# Patient Record
Sex: Female | Born: 1985 | Race: White | Hispanic: No | Marital: Married | Smoking: Former smoker
Health system: Southern US, Community
[De-identification: ages and names within clinical notes are randomized; demographics above are authoritative.]

## PROBLEM LIST (undated history)

## (undated) ENCOUNTER — Inpatient Hospital Stay (HOSPITAL_COMMUNITY): Payer: Self-pay

## (undated) DIAGNOSIS — K219 Gastro-esophageal reflux disease without esophagitis: Secondary | ICD-10-CM

## (undated) DIAGNOSIS — R519 Headache, unspecified: Secondary | ICD-10-CM

## (undated) DIAGNOSIS — O24419 Gestational diabetes mellitus in pregnancy, unspecified control: Secondary | ICD-10-CM

## (undated) DIAGNOSIS — R51 Headache: Secondary | ICD-10-CM

## (undated) DIAGNOSIS — F419 Anxiety disorder, unspecified: Secondary | ICD-10-CM

## (undated) DIAGNOSIS — F32A Depression, unspecified: Secondary | ICD-10-CM

## (undated) DIAGNOSIS — F329 Major depressive disorder, single episode, unspecified: Secondary | ICD-10-CM

## (undated) DIAGNOSIS — D649 Anemia, unspecified: Secondary | ICD-10-CM

## (undated) DIAGNOSIS — M797 Fibromyalgia: Secondary | ICD-10-CM

## (undated) DIAGNOSIS — Z8619 Personal history of other infectious and parasitic diseases: Secondary | ICD-10-CM

## (undated) HISTORY — DX: Anemia, unspecified: D64.9

## (undated) HISTORY — DX: Headache: R51

## (undated) HISTORY — DX: Gestational diabetes mellitus in pregnancy, unspecified control: O24.419

## (undated) HISTORY — PX: TONSILLECTOMY: SHX5217

## (undated) HISTORY — DX: Depression, unspecified: F32.A

## (undated) HISTORY — DX: Anxiety disorder, unspecified: F41.9

## (undated) HISTORY — DX: Major depressive disorder, single episode, unspecified: F32.9

## (undated) HISTORY — PX: CHOLECYSTECTOMY: SHX55

## (undated) HISTORY — DX: Headache, unspecified: R51.9

## (undated) HISTORY — DX: Personal history of other infectious and parasitic diseases: Z86.19

## (undated) HISTORY — PX: IUD REMOVAL: SHX5392

---
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
## 2009-08-06 ENCOUNTER — Ambulatory Visit: Payer: Self-pay | Admitting: Internal Medicine

## 2009-08-06 DIAGNOSIS — F411 Generalized anxiety disorder: Secondary | ICD-10-CM | POA: Insufficient documentation

## 2009-08-06 DIAGNOSIS — J45909 Unspecified asthma, uncomplicated: Secondary | ICD-10-CM | POA: Insufficient documentation

## 2009-08-06 DIAGNOSIS — F329 Major depressive disorder, single episode, unspecified: Secondary | ICD-10-CM

## 2009-11-05 ENCOUNTER — Ambulatory Visit: Payer: Self-pay | Admitting: Internal Medicine

## 2009-11-10 ENCOUNTER — Telehealth: Payer: Self-pay | Admitting: Internal Medicine

## 2009-11-10 ENCOUNTER — Encounter: Payer: Self-pay | Admitting: Internal Medicine

## 2009-11-12 ENCOUNTER — Encounter: Payer: Self-pay | Admitting: Internal Medicine

## 2009-11-12 ENCOUNTER — Ambulatory Visit: Payer: Self-pay | Admitting: Internal Medicine

## 2009-11-13 ENCOUNTER — Encounter: Payer: Self-pay | Admitting: Internal Medicine

## 2009-11-13 ENCOUNTER — Telehealth: Payer: Self-pay | Admitting: Internal Medicine

## 2009-12-12 ENCOUNTER — Telehealth: Payer: Self-pay | Admitting: Internal Medicine

## 2010-02-02 ENCOUNTER — Telehealth: Payer: Self-pay | Admitting: Internal Medicine

## 2010-02-02 ENCOUNTER — Ambulatory Visit: Payer: Self-pay | Admitting: Internal Medicine

## 2010-11-17 NOTE — Letter (Signed)
Summary: Out of [facility]  [address] [place], [place] [address]   Phone: [phone]  Fax: [phone]     November 10, 2009    Student:  [employer] November    To Whom It May Concern:   For Medical reasons, please excuse the above named student from school for the following dates:     November 05, 2009 and November 06, 2009    If you need additional information, please feel free to contact our office.   Sincerely,    Rock Nephew CMA for Sanda Linger, M.D.    ****This is a legal document and cannot be tampered with.  Schools are authorized to verify all information and to do so accordingly.

## 2010-11-17 NOTE — Progress Notes (Signed)
Summary: Results, school note  Phone Note Call from Patient Call back at [facility] Phone [phone]   Summary of Call: Patient is calling for for chest xray results and states that she has now started vomiting. Please advise. Initial call taken by: Lucious Groves,  November 13, 2009 9:01 AM  Follow-up for Phone Call        xray normal, go to an ER if needed Follow-up by: Etta Grandchild MD,  November 13, 2009 9:04 AM  Additional Follow-up for Phone Call Additional follow up Details #1::        Pt informed, she is req note for school for yesterday and today. OK?  Additional Follow-up by: Lamar Sprinkles, CMA,  November 13, 2009 10:21 AM    Additional Follow-up for Phone Call Additional follow up Details #2::    yes Follow-up by: Etta Grandchild MD,  November 13, 2009 10:30 AM  Additional Follow-up for Phone Call Additional follow up Details #3:: Details for Additional Follow-up Action Taken: Pt informed  Additional Follow-up by: Lamar Sprinkles, CMA,  November 14, 2009 12:06 PM

## 2010-11-17 NOTE — Assessment & Plan Note (Signed)
Summary: NOT ANY BETTER/ CHILLS/ ACHES/ NO FEVER/NWS  #   Vital Signs:  Patient profile:   25 year old female Menstrual status:  irregular Height:      68 inches Weight:      199 pounds BMI:     30.37 O2 Sat:      97 % on Room air Temp:     97.7 degrees F oral Pulse rate:   97 / minute Pulse rhythm:   regular Resp:     16 per minute BP sitting:   116 / 80  (left arm) Cuff size:   large  Vitals Entered By: Rock Nephew CMA (November 12, 2009 10:26 AM)  Nutrition Counseling: Patient's BMI is greater than 25 and therefore counseled on weight management options.  O2 Flow:  Room air  Primary Care Provider:  Etta Grandchild MD   History of Present Illness: She returns with persistent URI symptoms. She was getting better until symptoms worsened 2 days ago with red eyes, runny nose, NP cough, chills, and  LGF, but no wheezing, night sweats, sore throat, facial pain, earaches.  Asthma History    Asthma Control Assessment:    Age range: 12+ years    Symptoms: 0-2 days/week    Nighttime Awakenings: 0-2/month    Interferes w/ normal activity: no limitations    SABA use (not for EIB): 0-2 days/week    ATAQ questionnaire: 0    Asthma Control Assessment: Well Controlled   Current Medications (verified): 1)  Zoloft 100 Mg Tabs (Sertraline Hcl) .... Take 1 Tablet By Mouth Once A Day 2)  Lunesta 2 Mg Tabs (Eszopiclone) .... As Needed 3)  Xopenex 4)  Qvar 80 Mcg/act Aers (Beclomethasone Dipropionate) .... One Puff Bid 5)  Mirena 20 Mcg/24hr Iud (Levonorgestrel)  Allergies (verified): No Known Drug Allergies  Past History:  Past Medical History: Reviewed history from 08/06/2009 and no changes required. Anxiety Asthma Depression  Past Surgical History: Reviewed history from 08/06/2009 and no changes required. Cholecystectomy Tonsillectomy  Family History: Reviewed history from 08/06/2009 and no changes required. Family History Lung cancer  Social History: Reviewed  history from 08/06/2009 and no changes required. 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  Single Never Smoked Alcohol use-yes Drug use-no Regular exercise-yes  Review of Systems  The patient denies anorexia, weight loss, chest pain, headaches, hemoptysis, abdominal pain, and enlarged lymph nodes.   Eyes:  Complains of red eye; denies blurring, discharge, double vision, eye irritation, eye pain, itching, and light sensitivity. ENT:  Complains of earache; denies decreased hearing, difficulty swallowing, ear discharge, nosebleeds, sinus pressure, and sore throat.  Physical Exam  General:  alert, well-developed, well-nourished, and well-hydrated.   Eyes:  conjunctival injection, L>R, but no exudate.   Ears:  R ear normal and L ear normal.   Nose:  External nasal examination shows no deformity or inflammation. Nasal mucosa are pink and moist without lesions or exudates. Mouth:  no exudates, no pharyngeal crowing, no lesions, no aphthous ulcers, no erosions, no tongue abnormalities, no leukoplakia, no petechiae, pharyngeal erythema, and posterior lymphoid hypertrophy.   Neck:  supple, full ROM, no masses, no thyromegaly, no thyroid nodules or tenderness, no cervical lymphadenopathy, and no neck tenderness.   Lungs:  normal respiratory effort, no intercostal retractions, no accessory muscle use, normal breath sounds, no dullness, no fremitus, no crackles, and no wheezes.   Heart:  normal rate, regular rhythm, no murmur, no gallop, no rub, and no JVD.  Abdomen:  soft, non-tender, normal bowel sounds, no distention, no masses, no guarding, no rigidity, no rebound tenderness, no hepatomegaly, and no splenomegaly.   Msk:  No deformity or scoliosis noted of thoracic or lumbar spine.   Pulses:  R and L carotid,radial,femoral,dorsalis pedis and posterior tibial pulses are full and equal bilaterally Extremities:  No clubbing, cyanosis, edema, or deformity noted with  normal full range of motion of all joints.   Neurologic:  No cranial nerve deficits noted. Station and gait are normal. Plantar reflexes are down-going bilaterally. DTRs are symmetrical throughout. Sensory, motor and coordinative functions appear intact. Skin:  Intact without suspicious lesions or rashes Cervical Nodes:  no anterior cervical adenopathy and no posterior cervical adenopathy.   Axillary Nodes:  no R axillary adenopathy and no L axillary adenopathy.   Psych:  Oriented X3, memory intact for recent and remote, normally interactive, good eye contact, not agitated, not suicidal, not homicidal, subdued, and slightly anxious.     Impression & Recommendations:  Problem # 1:  BRONCHITIS-ACUTE (ICD-466.0) Assessment New  Her updated medication list for this problem includes:    Qvar 80 Mcg/act Aers (Beclomethasone dipropionate) ..... One puff bid    Mytussin Ac 100-10 Mg/67ml Syrp (Guaifenesin-codeine) .Marland Kitchen... 5-10 ml by mouth qid as needed for cough    Ceftin 500 Mg Tab (Cefuroxime axetil) .Marland Kitchen... Take one (1) tablet by mouth two (2) times a day x 10 days  Orders: Admin of Therapeutic Inj  intramuscular or subcutaneous (13244) Depo- Medrol 40mg  (J1030) Depo- Medrol 80mg  (J1040)  Problem # 2:  COUGH (ICD-786.2) Assessment: New  Orders: T-2 View CXR (71020TC)  Problem # 3:  ASTHMA (ICD-493.90) Assessment: Improved  Her updated medication list for this problem includes:    Qvar 80 Mcg/act Aers (Beclomethasone dipropionate) ..... One puff bid  Pulmonary Functions Reviewed: O2 sat: 97 (11/12/2009)  Complete Medication List: 1)  Zoloft 100 Mg Tabs (Sertraline hcl) .... Take 1 tablet by mouth once a day 2)  Lunesta 2 Mg Tabs (Eszopiclone) .... As needed 3)  Xopenex  4)  Qvar 80 Mcg/act Aers (Beclomethasone dipropionate) .... One puff bid 5)  Mirena 20 Mcg/24hr Iud (Levonorgestrel) 6)  Mytussin Ac 100-10 Mg/45ml Syrp (Guaifenesin-codeine) .... 5-10 ml by mouth qid as needed for  cough 7)  Ceftin 500 Mg Tab (Cefuroxime axetil) .... Take one (1) tablet by mouth two (2) times a day x 10 days  Patient Instructions: 1)  Please schedule a follow-up appointment in 2 weeks. 2)  Take your antibiotic as prescribed until ALL of it is gone, but stop if you develop a rash or swelling and contact our office as soon as possible. 3)  Acute bronchitis symptoms for less than 10 days are not helped by antibiotics. take over the counter cough medications. call if no improvment in  5-7 days, sooner if increasing cough, fever, or new symptoms( shortness of breath, chest pain). Prescriptions: CEFTIN 500 MG TAB (CEFUROXIME AXETIL) Take one (1) tablet by mouth two (2) times a day X 10 days  #20 x 1   Entered and Authorized by:   Etta Grandchild MD   Signed by:   Etta Grandchild MD on 11/12/2009   Method used:   Print then Give to Patient   RxID:   [id number] MYTUSSIN AC 100-10 MG/5ML SYRP (GUAIFENESIN-CODEINE) 5-10 ml by mouth QID as needed for cough  #6 ounces x 1   Entered and Authorized by:   Etta Grandchild MD  Signed by:   Etta Grandchild MD on 11/12/2009   Method used:   Print then Give to Patient   RxID:   [id number]    Medication Administration  Injection # 1:    Medication: Depo- Medrol 80mg     Diagnosis: BRONCHITIS-ACUTE (ICD-466.0)    Route: IM    Site: R deltoid    Exp Date: 07/2010    Lot #: [id number]    Mfr: [email or web address]    Patient tolerated injection without complications    Given by: Rock Nephew CMA (November 12, 2009 11:06 AM)  Injection # 2:    Medication: Depo- Medrol 40mg     Diagnosis: BRONCHITIS-ACUTE (ICD-466.0)    Route: IM    Site: R deltoid    Exp Date: 07/2010    Lot #: [id number]    Mfr: [email or web address]    Patient tolerated injection without complications    Given by: Rock Nephew CMA (November 12, 2009 11:06 AM)  Orders Added: 1)  Admin of Therapeutic Inj  intramuscular or subcutaneous [96372] 2)  Depo- Medrol 40mg  [J1030] 3)  Depo- Medrol 80mg   [J1040] 4)  T-2 View CXR [71020TC] 5)  Est. Patient Level IV [84696]

## 2010-11-17 NOTE — Progress Notes (Signed)
Summary: REFERRAL  Phone Note Call from Patient Call back at Home Phone [phone]   Summary of Call: Pt was referred to psychiatrist. They are unable to see pt until end of May. Pt's aunt is req suggestion for earlier apt.  Initial call taken by: Lamar Sprinkles, CMA,  February 02, 2010 11:18 AM  Follow-up for Phone Call        [facility] [phone] Follow-up by: Etta Grandchild MD,  February 02, 2010 11:22 AM  Additional Follow-up for Phone Call Additional follow up Details #1::        Pt informed  Additional Follow-up by: Lamar Sprinkles, CMA,  February 02, 2010 1:44 PM

## 2010-11-17 NOTE — Assessment & Plan Note (Signed)
Summary: sore throat-fever-body aches  stc   Vital Signs:  Patient profile:   25 year old female Menstrual status:  irregular Height:      68 inches Weight:      200 pounds BMI:     30.52 O2 Sat:      97 % on Room air Temp:     98.1 degrees F oral Pulse rate:   80 / minute Pulse rhythm:   regular Resp:     16 per minute BP sitting:   112 / 70  (left arm) Cuff size:   large  Vitals Entered By: Rock Nephew CMA (November 05, 2009 1:24 PM)  Nutrition Counseling: Patient's BMI is greater than 25 and therefore counseled on weight management options.  O2 Flow:  Room air CC: fever, congestion, bodyache, , URI symptoms Is Patient Diabetic? No   Primary Care Provider:  Etta Grandchild MD  CC:  fever, congestion, bodyache, , and URI symptoms.  History of Present Illness:  URI Symptoms      This is a 25 year old woman who presents with URI symptoms.  The symptoms began 3 days ago.  The severity is described as moderate.  The patient reports nasal congestion, purulent nasal discharge, sore throat, and sick contacts, but denies dry cough, productive cough, and earache.  Associated symptoms include low-grade fever (<100.5 degrees).  The patient denies stiff neck, dyspnea, wheezing, rash, vomiting, diarrhea, and use of an antipyretic.  The patient denies headache, muscle aches, and severe fatigue.  Risk factors for Strep sinusitis include absence of cough.  The patient denies the following risk factors for Strep sinusitis: unilateral facial pain, unilateral nasal discharge, poor response to decongestant, double sickening, tooth pain, and tender adenopathy.    Asthma History    Asthma Control Assessment:    Age range: 12+ years    Symptoms: 0-2 days/week    Nighttime Awakenings: 0-2/month    Interferes w/ normal activity: no limitations    Asthma Control Assessment: Well Controlled   Preventive Screening-Counseling & Management  Alcohol-Tobacco     Alcohol drinks/day: <1     Alcohol  type: beer     >5/day in last 3 mos: no     Alcohol Counseling: not indicated; use of alcohol is not excessive or problematic     Feels need to cut down: no     Feels annoyed by complaints: no     Feels guilty re: drinking: no     Needs 'eye opener' in am: no     Smoking Status: never     Passive Smoke Exposure: no  Hep-HIV-STD-Contraception     Hepatitis Risk: no risk noted     HIV Risk: no risk noted     STD Risk: no risk noted      Drug Use:  no.    Medications Prior to Update: 1)  Zoloft 100 Mg Tabs (Sertraline Hcl) .... Take 1 Tablet By Mouth Once A Day 2)  Lunesta 2 Mg Tabs (Eszopiclone) .... As Needed 3)  Xopenex 4)  Qvar 80 Mcg/act Aers (Beclomethasone Dipropionate) .... One Puff Bid 5)  Mirena 20 Mcg/24hr Iud (Levonorgestrel)  Current Medications (verified): 1)  Zoloft 100 Mg Tabs (Sertraline Hcl) .... Take 1 Tablet By Mouth Once A Day 2)  Lunesta 2 Mg Tabs (Eszopiclone) .... As Needed 3)  Xopenex 4)  Qvar 80 Mcg/act Aers (Beclomethasone Dipropionate) .... One Puff Bid 5)  Mirena 20 Mcg/24hr Iud (Levonorgestrel)  Allergies (verified): No Known  Drug Allergies  Past History:  Past Medical History: Reviewed history from 08/06/2009 and no changes required. Anxiety Asthma Depression  Past Surgical History: Reviewed history from 08/06/2009 and no changes required. Cholecystectomy Tonsillectomy  Family History: Reviewed history from 08/06/2009 and no changes required. Family History Lung cancer  Social History: Reviewed history from 08/06/2009 and no changes required. 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  Single Never Smoked Alcohol use-yes Drug use-no Regular exercise-yes  Review of Systems  The patient denies anorexia, prolonged cough, abdominal pain, hematuria, suspicious skin lesions, enlarged lymph nodes, and angioedema.    Physical Exam  General:  alert, well-developed, well-nourished, and well-hydrated.     Ears:  R ear normal and L ear normal.   Nose:  External nasal examination shows no deformity or inflammation. Nasal mucosa are pink and moist without lesions or exudates. Mouth:  no exudates, no pharyngeal crowing, no lesions, no aphthous ulcers, no erosions, no tongue abnormalities, no leukoplakia, no petechiae, pharyngeal erythema, and posterior lymphoid hypertrophy.   Neck:  supple, full ROM, no masses, no thyromegaly, no thyroid nodules or tenderness, no cervical lymphadenopathy, and no neck tenderness.   Lungs:  normal respiratory effort, no intercostal retractions, no accessory muscle use, normal breath sounds, no dullness, no fremitus, no crackles, and no wheezes.   Heart:  normal rate, regular rhythm, no murmur, no gallop, no rub, and no JVD.   Abdomen:  soft, non-tender, normal bowel sounds, no distention, no masses, no guarding, no rigidity, no rebound tenderness, no hepatomegaly, and no splenomegaly.   Msk:  No deformity or scoliosis noted of thoracic or lumbar spine.   Pulses:  R and L carotid,radial,femoral,dorsalis pedis and posterior tibial pulses are full and equal bilaterally Extremities:  No clubbing, cyanosis, edema, or deformity noted with normal full range of motion of all joints.   Skin:  Intact without suspicious lesions or rashes Cervical Nodes:  no anterior cervical adenopathy and no posterior cervical adenopathy.   Axillary Nodes:  no R axillary adenopathy and no L axillary adenopathy.   Psych:  Oriented X3, memory intact for recent and remote, normally interactive, good eye contact, not agitated, not suicidal, not homicidal, subdued, and slightly anxious.     Impression & Recommendations:  Problem # 1:  PHARYNGITIS-ACUTE (ICD-462) Assessment New  Her updated medication list for this problem includes:    Zithromax Tri-pak 500 Mg Tab (Azithromycin) .Marland Kitchen... Take as directed once daily for 3 days  Instructed to complete antibiotics and call if not improved in 48 hours.    Problem # 2:  ASTHMA (ICD-493.90) Assessment: Improved  Her updated medication list for this problem includes:    Qvar 80 Mcg/act Aers (Beclomethasone dipropionate) ..... One puff bid  Pulmonary Functions Reviewed: O2 sat: 97 (11/05/2009)  Complete Medication List: 1)  Zoloft 100 Mg Tabs (Sertraline hcl) .... Take 1 tablet by mouth once a day 2)  Lunesta 2 Mg Tabs (Eszopiclone) .... As needed 3)  Xopenex  4)  Qvar 80 Mcg/act Aers (Beclomethasone dipropionate) .... One puff bid 5)  Mirena 20 Mcg/24hr Iud (Levonorgestrel) 6)  Zithromax Tri-pak 500 Mg Tab (Azithromycin) .... Take as directed once daily for 3 days  Patient Instructions: 1)  Please schedule a follow-up appointment in 1 month. 2)  Get plenty of rest, drink lots of clear liquids, and use Tylenol or Ibuprofen for fever and comfort. Return in 7-10 days if you're not better:sooner if you're feeling worse. 3)  Take your antibiotic  as prescribed until ALL of it is gone, but stop if you develop a rash or swelling and contact our office as soon as possible. Prescriptions: ZITHROMAX TRI-PAK 500 MG TAB (AZITHROMYCIN) Take as directed once daily for 3 days  #3 x 0   Entered and Authorized by:   Etta Grandchild MD   Signed by:   Etta Grandchild MD on 11/05/2009   Method used:   Electronically to        [facility]  [id number]* (retail)       [address]       [place], [place]  [address]       Ph: [phone] or [phone]       Fax: [phone]   RxID:   [id number]

## 2010-11-17 NOTE — Letter (Signed)
Summary: Out of [facility]  [address] [place], [place] [address]   Phone: [phone]  Fax: [phone]    November 13, 2009   Student:  [employer] November    To Whom It May Concern:   For Medical reasons, please excuse the above named student from school for the following dates:  Start:   November 13, 2009  End:    November 14, 2009  If you need additional information, please feel free to contact our office.   Sincerely,    Lamar Sprinkles, CMA (AAMA) for Dr Leitha Schuller    ****This is a legal document and cannot be tampered with.  Schools are authorized to verify all information and to do so accordingly.

## 2010-11-17 NOTE — Progress Notes (Signed)
Summary: Alfonso Patten approval  Phone Note From Pharmacy   Caller: [phone] Summary of Call: PA request--Lunesta. I called prior authorization company and gave dx of insomnia. Med approved until 12-02-2010. Initial call taken by: Lucious Groves,  December 12, 2009 8:28 AM

## 2010-11-17 NOTE — Letter (Signed)
Summary: Results Follow-up Letter  [facility]  [address] [place], [place] [address]   Phone: [phone]  Fax: [phone]    11/12/2009  [id number] [place], [place]  [address]  Dear Ms. WELLS,   The following are the results of your recent test(s):  Test     Result     Chest Xray   _________________________________________________________  Please call for an appointment as directed _________________________________________________________ _________________________________________________________ _________________________________________________________  Sincerely,  Sanda Linger MD [facility]

## 2010-11-17 NOTE — Assessment & Plan Note (Signed)
Summary: FU ON MEDS/NWS #   Vital Signs:  Patient profile:   25 year old female Menstrual status:  irregular Height:      68 inches Weight:      195 pounds BMI:     29.76 O2 Sat:      97 % on Room air Temp:     98.2 degrees F oral Pulse rate:   93 / minute Pulse rhythm:   regular Resp:     16 per minute BP sitting:   102 / 64  (left arm) Cuff size:   large  Vitals Entered By: Rock Nephew CMA (February 02, 2010 8:45 AM)  Nutrition Counseling: Patient's BMI is greater than 25 and therefore counseled on weight management options.  O2 Flow:  Room air CC: follow-up visit//discuss follow up Is Patient Diabetic? No Pain Assessment Patient in pain? no        Primary Care Provider:  Etta Grandchild MD  CC:  follow-up visit//discuss follow up.  History of Present Illness: She returns for f/up and she says that Zoloft isn't working b/c she has constant worry "about everyhting all the time". She smokes marijuana to calm her nerves. She feels shaky and panicky at times. She has a good appetite but has lost weight in the last year. She feels a lack of motivation and has a hard time getting out of bed in the morning. She has insomnia but takes [place] with success.  Preventive Screening-Counseling & Management  Alcohol-Tobacco     Alcohol drinks/day: <1     Alcohol type: beer     >5/day in last 3 mos: no     Alcohol Counseling: not indicated; use of alcohol is not excessive or problematic     Feels need to cut down: no     Feels annoyed by complaints: no     Feels guilty re: drinking: no     Needs 'eye opener' in am: no     Smoking Status: never     Passive Smoke Exposure: no  Hep-HIV-STD-Contraception     Hepatitis Risk: no risk noted     HIV Risk: no risk noted     STD Risk: no risk noted      Sexual History:  currently monogamous.        Drug Use:  marijuana.        Blood Transfusions:  no.    Medications Prior to Update: 1)  Zoloft 100 Mg Tabs (Sertraline Hcl)  .... Take 1 Tablet By Mouth Once A Day 2)  Lunesta 2 Mg Tabs (Eszopiclone) .... As Needed 3)  Xopenex 4)  Qvar 80 Mcg/act Aers (Beclomethasone Dipropionate) .... One Puff Bid 5)  Mirena 20 Mcg/24hr Iud (Levonorgestrel) 6)  Mytussin Ac 100-10 Mg/17ml Syrp (Guaifenesin-Codeine) .... 5-10 Ml By Mouth Qid As Needed For Cough  Current Medications (verified): 1)  Zoloft 100 Mg Tabs (Sertraline Hcl) .... Take 1 Tablet By Mouth Once A Day 2)  Lunesta 2 Mg Tabs (Eszopiclone) .... As Needed 3)  Xopenex 4)  Qvar 80 Mcg/act Aers (Beclomethasone Dipropionate) .... One Puff Bid 5)  Mirena 20 Mcg/24hr Iud (Levonorgestrel)  Allergies (verified): No Known Drug Allergies  Past History:  Past Medical History: Reviewed history from 08/06/2009 and no changes required. Anxiety Asthma Depression  Past Surgical History: Reviewed history from 08/06/2009 and no changes required. Cholecystectomy Tonsillectomy  Family History: Reviewed history from 08/06/2009 and no changes required. Family History Lung cancer  Social History: Reviewed history from 08/06/2009  and no changes required. 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  Single Never Smoked Alcohol use-yes Drug use-no Regular exercise-yes Drug Use:  marijuana Sexual History:  currently monogamous Blood Transfusions:  no  Review of Systems  The patient denies chest pain, peripheral edema, headaches, abdominal pain, and suspicious skin lesions.   Psych:  Complains of anxiety, depression, easily tearful, irritability, and panic attacks; denies alternate hallucination ( auditory/visual), easily angered, sense of great danger, suicidal thoughts/plans, thoughts of violence, unusual visions or sounds, and thoughts /plans of harming others.  Physical Exam  General:  alert, well-developed, well-nourished, and well-hydrated.   Mouth:  no exudates, no pharyngeal crowing, no lesions, no aphthous ulcers, no erosions, no  tongue abnormalities, no leukoplakia, no petechiae, pharyngeal erythema, and posterior lymphoid hypertrophy.   Neck:  supple, full ROM, no masses, no thyromegaly, no thyroid nodules or tenderness, no cervical lymphadenopathy, and no neck tenderness.   Lungs:  normal respiratory effort, no intercostal retractions, no accessory muscle use, normal breath sounds, no dullness, no fremitus, no crackles, and no wheezes.   Heart:  normal rate, regular rhythm, no murmur, no gallop, no rub, and no JVD.   Abdomen:  soft, non-tender, normal bowel sounds, no distention, no masses, no guarding, no rigidity, no rebound tenderness, no hepatomegaly, and no splenomegaly.   Msk:  No deformity or scoliosis noted of thoracic or lumbar spine.   Extremities:  No clubbing, cyanosis, edema, or deformity noted with normal full range of motion of all joints.   Psych:  Oriented X3, memory intact for recent and remote, good eye contact, not agitated, not suicidal, not homicidal, dysphoric affect, tearful, and moderately anxious.     Impression & Recommendations:  Problem # 1:  DEPRESSION (ICD-311) Assessment Deteriorated  i advised her that smoking marijuana is sabotagging her mental health and I asked her to stop asap. I told her that Zoloft can't work if there is THC abuse. I asked her to see Vernell Leep and [facility] for psychotherapy for anxiety/panic/substance abuse.   Her updated medication list for this problem includes:    Zoloft 100 Mg Tabs (Sertraline hcl) .Marland Kitchen... Take 1 tablet by mouth once a day  Discussed treatment options, including trial of antidpressant medication. Will refer to behavioral health. Follow-up call in in 24-48 hours and recheck in 2 weeks, sooner as needed. Patient agrees to call if any worsening of symptoms or thoughts of doing harm arise. Verified that the patient has no suicidal ideation at this time.   Complete Medication List: 1)  Zoloft 100 Mg Tabs (Sertraline hcl) .... Take 1  tablet by mouth once a day 2)  Lunesta 2 Mg Tabs (Eszopiclone) .... As needed 3)  Xopenex  4)  Qvar 80 Mcg/act Aers (Beclomethasone dipropionate) .... One puff bid 5)  Mirena 20 Mcg/24hr Iud (Levonorgestrel)  Patient Instructions: 1)  Please schedule a follow-up appointment in 2 months. 2)  It is important that you exercise regularly at least 20 minutes 5 times a week. If you develop chest pain, have severe difficulty breathing, or feel very tired , stop exercising immediately and seek medical attention. 3)  You need to lose weight. Consider a lower calorie diet and regular exercise.

## 2010-11-17 NOTE — Progress Notes (Signed)
Summary: SCHOOL NOTE  Phone Note Call from Patient Call back at Home Phone [phone]   Summary of Call: Patient is requesting out of school note for the 19th & 20th. OK ? Initial call taken by: Lamar Sprinkles, CMA,  November 10, 2009 1:47 PM  Follow-up for Phone Call        yes Follow-up by: Etta Grandchild MD,  November 10, 2009 1:52 PM  Additional Follow-up for Phone Call Additional follow up Details #1::        Note printed and signed//lmovm to pick up. letter placed up front Additional Follow-up by: Rock Nephew CMA,  November 10, 2009 3:30 PM

## 2011-03-22 ENCOUNTER — Ambulatory Visit (INDEPENDENT_AMBULATORY_CARE_PROVIDER_SITE_OTHER): Payer: Federal, State, Local not specified - PPO | Admitting: Internal Medicine

## 2011-03-22 ENCOUNTER — Encounter: Payer: Self-pay | Admitting: Internal Medicine

## 2011-03-22 DIAGNOSIS — J45909 Unspecified asthma, uncomplicated: Secondary | ICD-10-CM

## 2011-03-22 DIAGNOSIS — J209 Acute bronchitis, unspecified: Secondary | ICD-10-CM

## 2011-03-22 DIAGNOSIS — F411 Generalized anxiety disorder: Secondary | ICD-10-CM

## 2011-03-22 MED ORDER — PSEUDOEPH-HYDROCODONE 60-5 MG/5ML PO SOLN: 5.0000 mL | Freq: Four times a day (QID) | ORAL | Status: DC | PRN

## 2011-03-22 MED ORDER — BECLOMETHASONE DIPROPIONATE 80 MCG/ACT IN AERS: 1.0000 | INHALATION_SPRAY | Freq: Two times a day (BID) | RESPIRATORY_TRACT | Status: DC

## 2011-03-22 MED ORDER — AZITHROMYCIN 500 MG PO TABS: 500.0000 mg | ORAL_TABLET | Freq: Every day | ORAL | Status: AC

## 2011-03-22 MED ORDER — LEVALBUTEROL TARTRATE 45 MCG/ACT IN AERO: 1.0000 | INHALATION_SPRAY | RESPIRATORY_TRACT | Status: DC | PRN

## 2011-03-22 MED ORDER — AZITHROMYCIN 500 MG PO TABS: 500.0000 mg | ORAL_TABLET | Freq: Every day | ORAL | Status: DC

## 2011-03-22 NOTE — Assessment & Plan Note (Signed)
Start zpak for the infection and rezira for symptoms relief

## 2011-03-22 NOTE — Assessment & Plan Note (Signed)
She is doing well on qvar and xopenex so I will continue them

## 2011-03-22 NOTE — Patient Instructions (Signed)
Asthma, Adult Asthma is caused by narrowing of the air passages in the lungs. It may be triggered by pollen, dust, animal dander, molds, some foods, respiratory infections, exposure to smoke, exercise, emotional stress or other allergens (things that cause allergic reactions or allergies). Repeat attacks are common. HOME CARE INSTRUCTIONS  Use prescription medications as ordered by your caregiver.   Avoid pollen, dust, animal dander, molds, smoke and other things that cause attacks at home and at work.   You may have fewer attacks if you decrease dust in your home. Electrostatic air cleaners may help.   It may help to replace your pillows or mattress with materials less likely to cause allergies.   Talk to your caregiver about an action plan for managing asthma attacks at home, including, the use of a peak flow meter which measures the severity of your asthma attack. An action plan can help minimize or stop the attack without having to seek medical care.   If you are not on a fluid restriction, drink 8 to 10 glasses of water each day.   Always have a plan prepared for seeking medical attention, including, calling your physician, accessing local emergency care, and calling 911 (in the [place] for a severe attack.   Discuss possible exercise routines with your caregiver.   If animal dander is the cause of asthma, you may need to get rid of pets.  SEEK MEDICAL CARE IF:  You have wheezing and shortness of breath even if taking medicine to prevent attacks.   An oral temperature above 100.5 develops.   You have muscle aches, chest pain or thickening of sputum.   Your sputum changes from clear or white to yellow, green, gray or bloody.   You have any problems that may be related to the medicine you are taking (such as a rash, itching, swelling or trouble breathing).  SEEK IMMEDIATE MEDICAL CARE IF:  Your usual medicines do not stop your wheezing or there is increased coughing and/or  shortness of breath.   You have increased difficulty breathing.  You have an oral temperature above 100.5Bronchitis Bronchitis is the body's way of reacting to injury and/or infection (inflammation) of the bronchi. Bronchi are the air tubes that extend from the windpipe into the lungs. If the inflammation becomes severe, it may cause shortness of breath.  CAUSES Inflammation may be caused by: A virus.  Germs (bacteria).  Dust.  Allergens.  Pollutants and many other irritants.  The cells lining the bronchial tree are covered with tiny hairs (cilia). These constantly beat upward, away from the lungs, toward the mouth. This keeps the lungs free of pollutants. When these cells become too irritated and are unable to do their job, mucus begins to develop. This causes the characteristic cough of bronchitis. The cough clears the lungs when the cilia are unable to do their job. Without either of these protective mechanisms, the mucus would settle in the lungs. Then you would develop pneumonia. Smoking is a common cause of bronchitis and can contribute to pneumonia. Stopping this habit is the single most important thing you can do to help yourself. TREATMENT Your caregiver may prescribe an antibiotic if the cough is caused by bacteria. Also, medicines that open up your airways make it easier to breathe. Your caregiver may also recommend or prescribe an expectorant. It will loosen the mucus to be coughed up. Only take over-the-counter or prescription medicines for pain, discomfort, or fever as directed by your caregiver.  Removing whatever causes the  problem (smoking, for example) is critical to preventing the problem from getting worse.  Cough suppressants may be prescribed for relief of cough symptoms.  Inhaled medicines may be prescribed to help with symptoms now and to help prevent problems from returning.  For those with recurrent (chronic) bronchitis, there may be a need for steroid medicines.  SEEK  IMMEDIATE MEDICAL CARE IF: During treatment, you develop more pus-like mucus (purulent sputum).  You or your child has an oral temperature above 100.5, not controlled by medicine.  Your baby is older than 3 months with a rectal temperature of 102 F (38.9 C) or higher.  Your baby is 49 months old or younger with a rectal temperature of 100.4 F (38 C) or higher.  You become progressively more ill.  You have increased difficulty breathing, wheezing, or shortness of breath.  It is necessary to seek immediate medical care if you are elderly or sick from any other disease. MAKE SURE YOU: Understand these instructions.  Will watch your condition.  Will get help right away if you are not doing well or get worse.  Document Released: 10/04/2005 Document Re-Released: 12/29/2009  [facility] Patient Information 2011 [place], [place]., not controlled by medicine.  MAKE SURE YOU:  Understand these instructions.   Will watch your condition.   Will get help right away if you are not doing well or get worse.  Document Released: 10/04/2005 Document Re-Released: 10/26/2009 [facility] Patient Information 2011 [place], [place].

## 2011-03-22 NOTE — Progress Notes (Signed)
Subjective:    Patient ID: Crystal [name], female    DOB: 1986/08/20, 25 y.o.   MRN: [id number]  Asthma She complains of cough and wheezing. There is no chest tightness, difficulty breathing, frequent throat clearing, hemoptysis, hoarse voice or shortness of breath. This is a chronic problem. The current episode started more than 1 year ago. The problem occurs intermittently. The problem has been unchanged. The cough is non-productive. Associated symptoms include a fever, myalgias, nasal congestion, rhinorrhea, sneezing and a sore throat. Pertinent negatives include no appetite change, chest pain, dyspnea on exertion, ear congestion, ear pain, headaches, heartburn, malaise/fatigue, orthopnea, PND, postnasal drip, sweats, trouble swallowing or weight loss. Her symptoms are aggravated by URI. Her symptoms are alleviated by steroid inhaler and beta-agonist. She reports significant improvement on treatment. Her past medical history is significant for asthma. There is no history of bronchiectasis, bronchitis, COPD, emphysema or pneumonia.      Review of Systems  Constitutional: Positive for fever and chills. Negative for weight loss, malaise/fatigue, diaphoresis, activity change, appetite change, fatigue and unexpected weight change.  HENT: Positive for congestion, sore throat, rhinorrhea, sneezing and sinus pressure. Negative for hearing loss, ear pain, nosebleeds, hoarse voice, facial swelling, trouble swallowing, neck pain, voice change, postnasal drip, tinnitus and ear discharge.   Respiratory: Positive for cough and wheezing. Negative for hemoptysis, chest tightness, shortness of breath and stridor.   Cardiovascular: Negative for chest pain, dyspnea on exertion, palpitations, leg swelling and PND.  Gastrointestinal: Negative for heartburn, nausea, vomiting, abdominal pain, diarrhea, constipation and blood in stool.  Genitourinary: Negative for dysuria, urgency, frequency, hematuria, decreased urine  volume, enuresis, difficulty urinating and dyspareunia.  Musculoskeletal: Positive for myalgias. Negative for back pain, joint swelling, arthralgias and gait problem.  Skin: Negative for color change, pallor and rash.  Neurological: Negative for dizziness, tremors, seizures, syncope, facial asymmetry, speech difficulty, weakness, light-headedness, numbness and headaches.  Hematological: Negative for adenopathy. Does not bruise/bleed easily.  Psychiatric/Behavioral: Negative.        Objective:   Physical Exam  [vitalsreviewed. Constitutional: She is oriented to person, place, and time. She appears well-developed and well-nourished. No distress.  HENT:  Head: Normocephalic and atraumatic.  Right Ear: External ear normal.  Left Ear: External ear normal.  Nose: Nose normal.  Mouth/Throat: Oropharynx is clear and moist. No oropharyngeal exudate.  Eyes: Conjunctivae and EOM are normal. Pupils are equal, round, and reactive to light. Right eye exhibits no discharge. Left eye exhibits no discharge. No scleral icterus.  Neck: Normal range of motion. Neck supple. No JVD present. No tracheal deviation present. No thyromegaly present.  Cardiovascular: Normal rate, regular rhythm, normal heart sounds and intact distal pulses.  Exam reveals no gallop and no friction rub.   No murmur heard. Pulmonary/Chest: Effort normal and breath sounds normal. No stridor. No respiratory distress. She has no wheezes. She has no rales. She exhibits no tenderness.  Abdominal: Soft. Bowel sounds are normal. She exhibits no distension and no mass. There is no tenderness. There is no rebound and no guarding.  Musculoskeletal: Normal range of motion. She exhibits no edema and no tenderness.  Lymphadenopathy:    She has no cervical adenopathy.  Neurological: She is alert and oriented to person, place, and time. She has normal reflexes. She displays normal reflexes. No cranial nerve deficit. She exhibits normal muscle tone.  Coordination normal.  Skin: Skin is warm and dry. No rash noted. She is not diaphoretic. No erythema. No pallor.  Psychiatric: She has a  normal mood and affect. Her behavior is normal. Judgment and thought content normal.          Assessment & Plan:

## 2011-08-19 ENCOUNTER — Other Ambulatory Visit (INDEPENDENT_AMBULATORY_CARE_PROVIDER_SITE_OTHER): Payer: Federal, State, Local not specified - PPO

## 2011-08-19 ENCOUNTER — Encounter: Payer: Self-pay | Admitting: Internal Medicine

## 2011-08-19 ENCOUNTER — Ambulatory Visit (INDEPENDENT_AMBULATORY_CARE_PROVIDER_SITE_OTHER): Payer: Federal, State, Local not specified - PPO | Admitting: Internal Medicine

## 2011-08-19 VITALS — BP 118/78 | HR 64 | Temp 98.6°F | Resp 16 | Wt 221.0 lb

## 2011-08-19 DIAGNOSIS — R209 Unspecified disturbances of skin sensation: Secondary | ICD-10-CM

## 2011-08-19 DIAGNOSIS — F411 Generalized anxiety disorder: Secondary | ICD-10-CM

## 2011-08-19 DIAGNOSIS — F329 Major depressive disorder, single episode, unspecified: Secondary | ICD-10-CM

## 2011-08-19 DIAGNOSIS — R200 Anesthesia of skin: Secondary | ICD-10-CM | POA: Insufficient documentation

## 2011-08-19 LAB — CBC WITH DIFFERENTIAL/PLATELET
Basophils Absolute: 0 10*3/uL (ref 0.0–0.1)
Basophils Relative: 0.6 % (ref 0.0–3.0)
Eosinophils Absolute: 0.1 10*3/uL (ref 0.0–0.7)
MCHC: 33.7 g/dL (ref 30.0–36.0)
MCV: 89.7 fl (ref 78.0–100.0)
Monocytes Absolute: 0.3 10*3/uL (ref 0.1–1.0)
Neutrophils Relative %: 55.6 % (ref 43.0–77.0)
Platelets: 235 10*3/uL (ref 150.0–400.0)
RBC: 4.63 Mil/uL (ref 3.87–5.11)
RDW: 13.2 % (ref 11.5–14.6)

## 2011-08-19 LAB — FOLATE: Folate: 9.2 ng/mL (ref >5.9)

## 2011-08-19 LAB — COMPREHENSIVE METABOLIC PANEL
AST: 16 U/L (ref 0–37)
Albumin: 4.4 g/dL (ref 3.5–5.2)
Alkaline Phosphatase: 73 U/L (ref 39–117)
Potassium: 4.4 mEq/L (ref 3.5–5.1)
Sodium: 140 mEq/L (ref 135–145)
Total Protein: 7.7 g/dL (ref 6.0–8.3)

## 2011-08-19 MED ORDER — CLONAZEPAM 1 MG PO TABS: 1.0000 mg | ORAL_TABLET | Freq: Three times a day (TID) | ORAL | Status: DC

## 2011-08-19 MED ORDER — FLUOXETINE HCL 20 MG PO CAPS: 20.0000 mg | ORAL_CAPSULE | Freq: Every day | ORAL | Status: DC

## 2011-08-19 NOTE — Progress Notes (Signed)
Subjective:    Patient ID: Crystal [name], female    DOB: 10-05-1986, 25 y.o.   MRN: [id number]  HPI She returns c/o intermittent numbness and tingling in her arms and legs for several months and worsening anxiety/panic. Her GYN changed her from Zoloft to Prozac several months ago but she has not been taking either recently.   Review of Systems  Constitutional: Negative for fever, chills, diaphoresis, activity change, appetite change and fatigue.  HENT: Negative.   Eyes: Negative.   Respiratory: Negative for apnea, cough, choking, chest tightness, shortness of breath, wheezing and stridor.   Cardiovascular: Negative for chest pain, palpitations and leg swelling.  Gastrointestinal: Negative for nausea, vomiting, abdominal pain, diarrhea, constipation and blood in stool.  Genitourinary: Negative for dysuria, urgency, hematuria, flank pain, decreased urine volume, enuresis, difficulty urinating and dyspareunia.  Musculoskeletal: Negative for myalgias, back pain, joint swelling, arthralgias and gait problem.  Skin: Negative for color change, pallor, rash and wound.  Neurological: Positive for numbness. Negative for dizziness, tremors, seizures, syncope, facial asymmetry, speech difficulty, weakness, light-headedness and headaches.  Hematological: Negative for adenopathy. Does not bruise/bleed easily.  Psychiatric/Behavioral: Negative for suicidal ideas, hallucinations, behavioral problems, confusion, sleep disturbance, self-injury, dysphoric mood, decreased concentration and agitation. The patient is nervous/anxious. The patient is not hyperactive.        Objective:   Physical Exam  Vitals reviewed. Constitutional: She is oriented to person, place, and time. She appears well-developed and well-nourished. No distress.  HENT:  Head: Normocephalic and atraumatic.  Mouth/Throat: Oropharynx is clear and moist. No oropharyngeal exudate.  Eyes: Conjunctivae are normal. Right eye exhibits no  discharge. Left eye exhibits no discharge. No scleral icterus.  Neck: Normal range of motion. Neck supple. No JVD present. No tracheal deviation present. No thyromegaly present.  Cardiovascular: Normal rate, regular rhythm, normal heart sounds and intact distal pulses.  Exam reveals no gallop and no friction rub.   No murmur heard. Pulmonary/Chest: Effort normal and breath sounds normal. No stridor. No respiratory distress. She has no wheezes. She has no rales. She exhibits no tenderness.  Abdominal: Soft. Bowel sounds are normal. She exhibits no distension and no mass. There is no tenderness. There is no rebound and no guarding.  Musculoskeletal: Normal range of motion. She exhibits no edema and no tenderness.  Lymphadenopathy:    She has no cervical adenopathy.  Neurological: She is alert and oriented to person, place, and time. She has normal strength. She is not disoriented. She displays no atrophy, no tremor and normal reflexes. A sensory deficit (decreased sensation in arms and legs) is present. No cranial nerve deficit. She exhibits normal muscle tone. She displays a negative Romberg sign. She displays no seizure activity. Coordination and gait normal. She displays no Babinski's sign on the right side. She displays no Babinski's sign on the left side.  Reflex Scores:      Tricep reflexes are 1+ on the right side and 1+ on the left side.      Bicep reflexes are 1+ on the right side and 1+ on the left side.      Brachioradialis reflexes are 1+ on the right side and 1+ on the left side.      Patellar reflexes are 1+ on the right side and 1+ on the left side.      Achilles reflexes are 1+ on the right side and 1+ on the left side. Skin: Skin is warm and dry. No rash noted. She is not diaphoretic. No erythema.  No pallor.  Psychiatric: She has a normal mood and affect. Her behavior is normal. Judgment and thought content normal.         Assessment & Plan:

## 2011-08-19 NOTE — Assessment & Plan Note (Signed)
Restart prozac and since she is having some panic I will offer her an Rx for klonopin prn as well

## 2011-08-19 NOTE — Patient Instructions (Signed)

## 2011-08-19 NOTE — Assessment & Plan Note (Signed)
She has a sensory deficit so I will evaluate accordingly with labs and may consider doing an EMG/NCS if her symptoms persist

## 2011-08-19 NOTE — Assessment & Plan Note (Signed)
-   Restart prozac

## 2011-08-20 ENCOUNTER — Encounter: Payer: Self-pay | Admitting: Internal Medicine

## 2011-12-22 ENCOUNTER — Emergency Department (HOSPITAL_COMMUNITY)
Admission: EM | Admit: 2011-12-22 | Discharge: 2011-12-22 | Disposition: A | Discharge status: Home Health | Payer: Federal, State, Local not specified - PPO | Attending: Emergency Medicine | Admitting: Emergency Medicine

## 2011-12-22 ENCOUNTER — Other Ambulatory Visit: Payer: Self-pay

## 2011-12-22 ENCOUNTER — Encounter (HOSPITAL_COMMUNITY): Payer: Self-pay

## 2011-12-22 ENCOUNTER — Emergency Department (HOSPITAL_COMMUNITY): Payer: Federal, State, Local not specified - PPO

## 2011-12-22 DIAGNOSIS — R110 Nausea: Secondary | ICD-10-CM | POA: Insufficient documentation

## 2011-12-22 DIAGNOSIS — R0602 Shortness of breath: Secondary | ICD-10-CM | POA: Insufficient documentation

## 2011-12-22 DIAGNOSIS — J45909 Unspecified asthma, uncomplicated: Secondary | ICD-10-CM | POA: Insufficient documentation

## 2011-12-22 DIAGNOSIS — R079 Chest pain, unspecified: Secondary | ICD-10-CM | POA: Insufficient documentation

## 2011-12-22 NOTE — ED Provider Notes (Signed)
History     CSN: [id number]  Arrival date & time 12/22/11  1216   First MD Initiated Contact with Patient 12/22/11 1318     Chief Complaint  Patient presents with  . Chest Pain    Patient is a 26 y.o. female presenting with chest pain. The history is provided by the patient.  Chest Pain The chest pain began 3 - 5 hours ago. Chest pain occurs constantly. The chest pain is improving. The pain is associated with eating. The severity of the pain is moderate. The quality of the pain is described as burning. Radiates to: "throat/neck" Exacerbated by: nothing. Primary symptoms include shortness of breath and nausea. Pertinent negatives for primary symptoms include no fever, no fatigue, no syncope, no cough, no abdominal pain, no vomiting and no dizziness.   pt reports she ate this morning, soon after took her daily meds (no recent change) and about 20 minutes later noticed chest burning.  After this started she felt SOB.  No syncope.  No vomiting.  No diaphoresis.  No back pain.  CP was not pleuritic.  No recent surgery.  No h/o DVT/PE.  She is not on OCPs.   She is now feeling improved She has been on phentermine for one month, no reaction since starting    Past Medical History  Diagnosis Date  . Asthma   . Depression   . Anxiety     Past Surgical History  Procedure Date  . Cholecystectomy   . Tonsillectomy     Family History  Problem Relation Age of Onset  . Cancer Other     Lung    History  Substance Use Topics  . Smoking status: Current Everyday Smoker -- 0.5 packs/day  . Smokeless tobacco: Not on file   Comment: Regular Exercise - Yes  . Alcohol Use: 2.4 oz/week    4 Glasses of wine per week    OB History    Grav Para Term Preterm Abortions TAB SAB Ect Mult Living                  Review of Systems  Constitutional: Negative for fever and fatigue.  Respiratory: Positive for shortness of breath. Negative for cough.   Cardiovascular: Positive for chest pain.  Negative for syncope.  Gastrointestinal: Positive for nausea. Negative for vomiting and abdominal pain.  Neurological: Negative for dizziness.  All other systems reviewed and are negative.    Allergies  Review of patient's allergies indicates no known allergies.  Home Medications   Current Outpatient Rx  Name Route Sig Dispense Refill  . BECLOMETHASONE DIPROPIONATE 80 MCG/ACT IN AERS Inhalation Inhale 1 puff into the lungs 2 (two) times daily. 1 Inhaler 12  . CLONAZEPAM 1 MG PO TABS Oral Take 1 mg by mouth 3 (three) times daily as needed. For anixety    . FLUOXETINE HCL 20 MG PO CAPS Oral Take 1 capsule (20 mg total) by mouth daily. 30 capsule 11  . LEVALBUTEROL TARTRATE 45 MCG/ACT IN AERO Inhalation Inhale 1-2 puffs into the lungs every 4 (four) hours as needed for wheezing. 1 Inhaler 12  . PHENTERMINE HCL 37.5 MG PO CAPS Oral Take 37.5 mg by mouth every morning.      BP 142/99  Pulse 102  Temp(Src) 98.3 F (36.8 C) (Oral)  Resp 20  Ht 5\' 7"  (1.702 m)  Wt 200 lb (90.719 kg)  BMI 31.32 kg/m2  SpO2 97%  LMP 12/05/2011  Physical Exam CONSTITUTIONAL: Well developed/well nourished  HEAD AND FACE: Normocephalic/atraumatic EYES: EOMI/PERRL ENMT: Mucous membranes moist NECK: supple no meningeal signs SPINE:entire spine nontender CV: S1/S2 noted, no murmurs/rubs/gallops noted LUNGS: Lungs are clear to auscultation bilaterally, no apparent distress ABDOMEN: soft, nontender, no rebound or guarding GU:no cva tenderness NEURO: Pt is awake/alert, moves all extremitiesx4 EXTREMITIES: pulses normal, full ROM, no edema, no calf tenderness SKIN: warm, color normal PSYCH: no abnormalities of mood noted  ED Course  Procedures   Labs Reviewed - No data to display Dg Chest 2 View  12/22/2011  *RADIOLOGY REPORT*  Clinical Data: Chest pain  CHEST - 2 VIEW  Comparison: 11/12/2009  Findings: The lungs are clear without focal consolidation, edema, effusion or pneumothorax.  Cardiopericardial  silhouette is within normal limits for size.  Imaged bony structures of the thorax are intact.  IMPRESSION: Stable.  Normal exam.  Original Report Authenticated By: ERIC A. MANSELL, M.D.     1. Chest pain    Pt well appearing, improved on my exam, no distress My suspicion for ACS/PE/Dissection is low EKG nondiagnostic We discussed strict return precautions   The patient appears reasonably screened and/or stabilized for discharge and I doubt any other medical condition or other [facility] requiring further screening, evaluation, or treatment in the ED at this time prior to discharge   MDM  Nursing notes reviewed and considered in documentation xrays reviewed and considered     Date: 12/22/2011  Rate: 103  Rhythm: sinus tachycardia  QRS Axis: normal  Intervals: normal  ST/T Wave abnormalities: nonspecific ST changes  Conduction Disutrbances:none  Narrative Interpretation: artifact noted due to tremor  Old EKG Reviewed: none available    Joya Gaskins, MD 12/22/11 1413

## 2011-12-22 NOTE — Discharge Instructions (Signed)

## 2011-12-22 NOTE — ED Notes (Signed)
Pt presents with onset of chest "burning" after taking diet pill and prozac this morning.  Pt reports she has been on same meds x 1 month, but only began to burn in chest today.  Pt reports burning went up her throat and around to back of neck and back of her head.  +shortness of breath.

## 2012-03-24 ENCOUNTER — Other Ambulatory Visit: Payer: Self-pay | Admitting: Internal Medicine

## 2013-02-26 ENCOUNTER — Ambulatory Visit: Payer: Self-pay | Admitting: Certified Nurse Midwife

## 2013-03-04 IMAGING — CR DG CHEST 2V
2 series · 2 of 2 positions shown · non-contrast
Comparison: 11/12/2009

CLINICAL DATA: Chest pain

CHEST - 2 VIEW

[w chest pa]
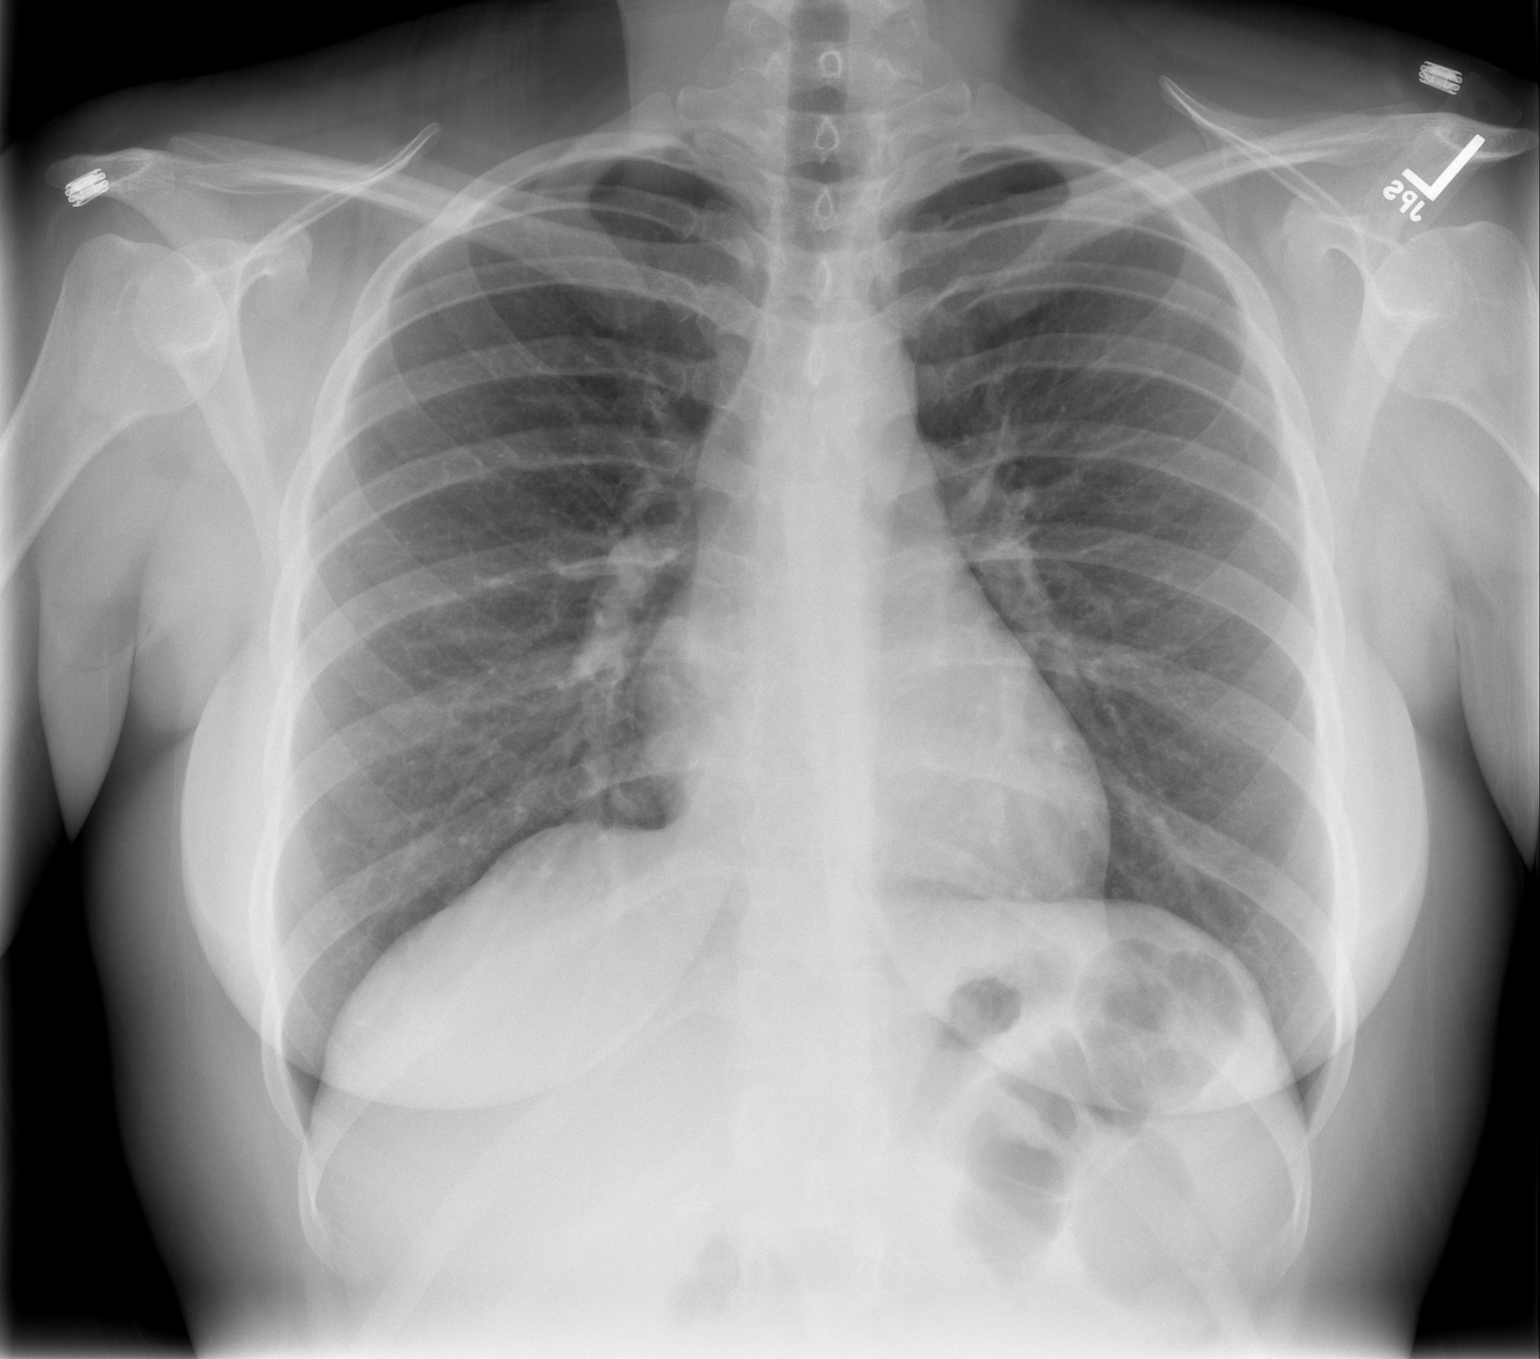

[w chest lat]
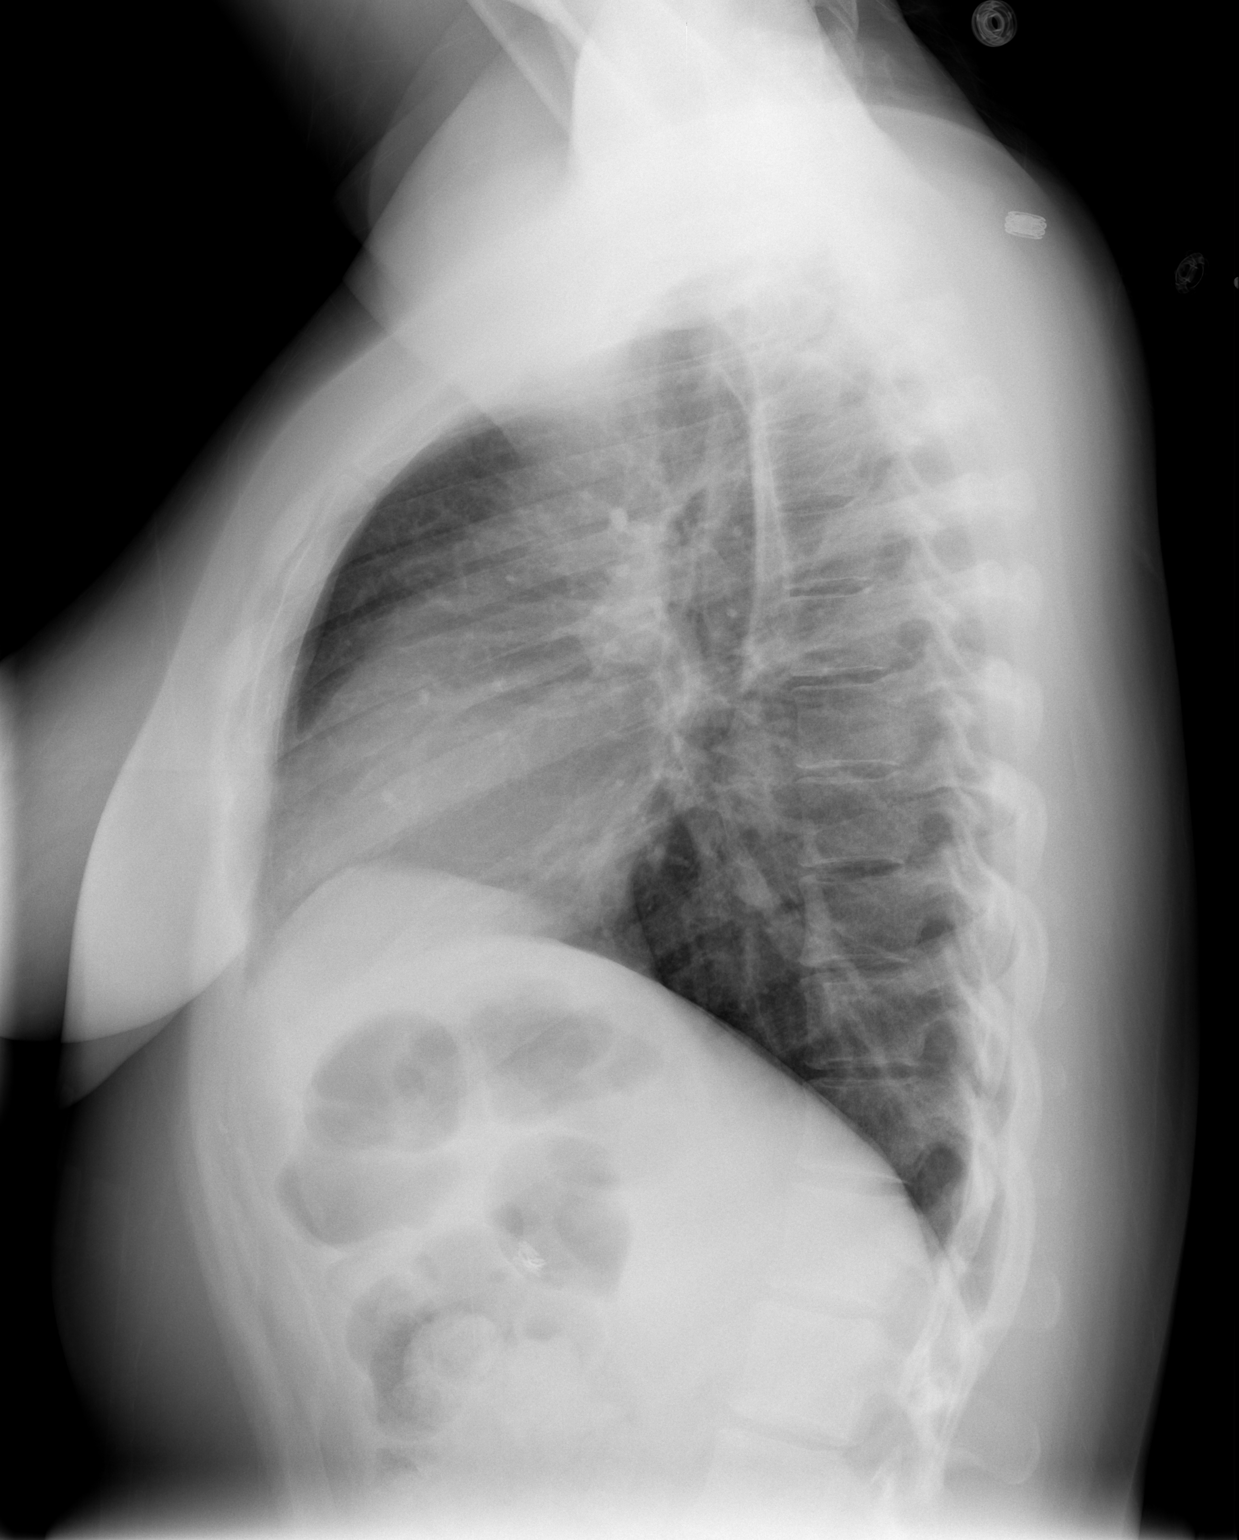

[2 of 2 positions shown; findings below may reference images not displayed]

FINDINGS: The lungs are clear without focal consolidation, edema,
effusion or pneumothorax.  Cardiopericardial silhouette is within
normal limits for size.  Imaged bony structures of the thorax are
intact.
IMPRESSION: Stable.  Normal exam.

## 2013-03-20 ENCOUNTER — Encounter: Payer: Self-pay | Admitting: Certified Nurse Midwife

## 2013-03-22 ENCOUNTER — Ambulatory Visit (INDEPENDENT_AMBULATORY_CARE_PROVIDER_SITE_OTHER): Payer: Federal, State, Local not specified - PPO | Admitting: Certified Nurse Midwife

## 2013-03-22 ENCOUNTER — Encounter: Payer: Self-pay | Admitting: Certified Nurse Midwife

## 2013-03-22 VITALS — BP 102/68 | HR 70 | Resp 16 | Ht 67.5 in | Wt 227.8 lb

## 2013-03-22 DIAGNOSIS — Z01419 Encounter for gynecological examination (general) (routine) without abnormal findings: Secondary | ICD-10-CM

## 2013-03-22 DIAGNOSIS — Z309 Encounter for contraceptive management, unspecified: Secondary | ICD-10-CM

## 2013-03-22 DIAGNOSIS — IMO0001 Reserved for inherently not codable concepts without codable children: Secondary | ICD-10-CM

## 2013-03-22 MED ORDER — NORETHIN ACE-ETH ESTRAD-FE 1-20 MG-MCG(24) PO TABS: 1.0000 | ORAL_TABLET | Freq: Every day | ORAL | Status: DC

## 2013-03-22 NOTE — Progress Notes (Signed)
27 y.o.   Single    Caucasian   female   [id number]   here for annual exam.  Periods spotting in between cycles, but has not been consistent with same time of day with taking pills.  Working on better schedule.  Happy, getting married in August 2014.  No health issues today.  Patient's last menstrual period was 03/18/2013.          Sexually active: yes  The current method of family planning is OCP (estrogen/progesterone).    Exercising: No Last mammogram:  n/a Last pap smear:02/21/12 History of abnormal pap: no Smoking:no has recently quit Alcohol:averages 1 per day wine or beer Last colonoscopy:n/a Last Bone Density: n/a  Last tetanus shot:before entering college Last cholesterol check: n/a  Hgb:      PCP          Urine: PCP   Family History  Problem Relation Age of Onset  . Cancer Other     Lung  . Anxiety disorder Mother     Patient Active Problem List   Diagnosis Date Noted  . Numbness and tingling 08/19/2011  . Anxiety, generalized 08/19/2011  . DEPRESSION 08/06/2009  . ASTHMA 08/06/2009    Past Medical History  Diagnosis Date  . Asthma   . Depression   . Anxiety     Past Surgical History  Procedure Laterality Date  . Cholecystectomy    . Tonsillectomy    . Iud removal      mirena inserted 1/08, removal 11/12  . Wisdom tooth extraction  2007    Allergies: Review of patient's allergies indicates no known allergies.  Current Outpatient Prescriptions  Medication Sig Dispense Refill  . beclomethasone (QVAR) 80 MCG/ACT inhaler Inhale 1 puff into the lungs 2 (two) times daily.  1 Inhaler  12  . clonazePAM (KLONOPIN) 1 MG tablet Take 1 tablet (1 mg total) by mouth 3 (three) times daily.  90 tablet  2  . clonazePAM (KLONOPIN) 1 MG tablet Take 1 mg by mouth 3 (three) times daily as needed. For anixety      . FLUoxetine (PROZAC) 20 MG capsule Take 1 capsule (20 mg total) by mouth daily.  30 capsule  11  . levalbuterol (XOPENEX HFA) 45 MCG/ACT inhaler Inhale 1-2 puffs  into the lungs every 4 (four) hours as needed for wheezing.  1 Inhaler  12  . Multiple Vitamins-Minerals (MULTIVITAMIN PO) Take by mouth daily.      . phentermine 37.5 MG capsule Take 37.5 mg by mouth every morning.       No current facility-administered medications for this visit.    ROS: Pertinent items are noted in HPI.      BP 102/68  Pulse 70  Resp 16  Ht 5' 7.5" (1.715 m)  Wt 227 lb 12.8 oz (103.329 kg)  BMI 35.13 kg/m2  LMP 03/18/2013   Wt Readings from Last 3 Encounters:  03/22/13 227 lb 12.8 oz (103.329 kg)  12/22/11 200 lb (90.719 kg)  08/19/11 221 lb (100.245 kg)     Ht Readings from Last 3 Encounters:  03/22/13 5' 7.5" (1.715 m)  12/22/11 5\' 7"  (1.702 m)  03/22/11 5\' 8"  (1.727 m)    General appearance: alert, cooperative and appears stated age Head: Normocephalic, without obvious abnormality, atraumatic Neck: no adenopathy, supple, symmetrical, trachea midline and thyroid not enlarged, symmetric, no tenderness/mass/nodules Lungs: clear to auscultation bilaterally Breasts: Inspection negative, No nipple retraction or dimpling, No nipple discharge or bleeding, No axillary or supraclavicular  adenopathy, Normal to palpation without dominant masses Heart: regular rate and rhythm Abdomen: soft, non-tender; bowel sounds normal; no masses,  no organomegaly Extremities: extremities normal, atraumatic, no cyanosis or edema Skin: Skin color, texture, turgor normal. No rashes or lesions Lymph nodes: Cervical, supraclavicular, and axillary nodes normal. No abnormal inguinal nodes palpated Neurologic: Grossly normal   Pelvic: External genitalia:  no lesions              Urethra:  normal appearing urethra with no masses, tenderness or lesions              Bartholins and Skenes: normal                 Vagina: normal appearing vagina with normal color and discharge, no lesions              Cervix: normal appearance              Pap taken: no        Bimanual Exam:   Uterus:  uterus is normal size, shape, consistency and nontender                                      Adnexa: normal adnexa in size, nontender and no masses                                      Rectovaginal: Confirms                                      Anus: deferred  A: Normal well woman exam Contraception: OCP     P:Reviewed health and wellness pertinent to exam Rx L[place] 24 fe see order pap smear per guidelines No pap smear taken today Wished well marriage counseled on breast self exam, use and side effects of OCP's, adequate intake of calcium and vitamin D, diet and exercise  return annually or prn     An After Visit Summary was printed and given to the patient.  Reviewed, TL

## 2013-03-22 NOTE — Patient Instructions (Signed)
General topics  Next pap or exam is  due in 1 year Take a Women's multivitamin Take 1200 mg. of calcium daily - prefer dietary If any concerns in interim to call back  Breast Self-Awareness Practicing breast self-awareness may pick up problems early, prevent significant medical complications, and possibly save your life. By practicing breast self-awareness, you can become familiar with how your breasts look and feel and if your breasts are changing. This allows you to notice changes early. It can also offer you some reassurance that your breast health is good. One way to learn what is normal for your breasts and whether your breasts are changing is to do a breast self-exam. If you find a lump or something that was not present in the past, it is best to contact your caregiver right away. Other findings that should be evaluated by your caregiver include nipple discharge, especially if it is bloody; skin changes or reddening; areas where the skin seems to be pulled in (retracted); or new lumps and bumps. Breast pain is seldom associated with cancer (malignancy), but should also be evaluated by a caregiver. BREAST SELF-EXAM The best time to examine your breasts is 5 7 days after your menstrual period is over.  ExitCare Patient Information 2013 [employer].   Exercise to Stay Healthy Exercise helps you become and stay healthy. EXERCISE IDEAS AND TIPS Choose exercises that:  You enjoy.  Fit into your day. You do not need to exercise really hard to be healthy. You can do exercises at a slow or medium level and stay healthy. You can:  Stretch before and after working out.  Try yoga, Pilates, or tai chi.  Lift weights.  Walk fast, swim, jog, run, climb stairs, bicycle, dance, or rollerskate.  Take aerobic classes. Exercises that burn about 150 calories:  Running 1  miles in 15 minutes.  Playing volleyball for 45 to 60 minutes.  Washing and waxing a car for 45 to 60  minutes.  Playing touch football for 45 minutes.  Walking 1  miles in 35 minutes.  Pushing a stroller 1  miles in 30 minutes.  Playing basketball for 30 minutes.  Raking leaves for 30 minutes.  Bicycling 5 miles in 30 minutes.  Walking 2 miles in 30 minutes.  Dancing for 30 minutes.  Shoveling snow for 15 minutes.  Swimming laps for 20 minutes.  Walking up stairs for 15 minutes.  Bicycling 4 miles in 15 minutes.  Gardening for 30 to 45 minutes.  Jumping rope for 15 minutes.  Washing windows or floors for 45 to 60 minutes. Document Released: 11/06/2010 Document Revised: 12/27/2011 Document Reviewed: 11/06/2010 Exit[employer] Patient Information 2013 [employer].   Other topics ( that may be useful information):    Sexually Transmitted Disease Sexually transmitted disease (STD) refers to any infection that is passed from person to person during sexual activity. This may happen by way of saliva, semen, blood, vaginal mucus, or urine. Common STDs include:  Gonorrhea.  Chlamydia.  Syphilis.  HIV/AIDS.  Genital herpes.  Hepatitis B and C.  Trichomonas.  Human papillomavirus (HPV).  Pubic lice. CAUSES  An STD may be spread by bacteria, virus, or parasite. A person can get an STD by:  Sexual intercourse with an infected person.  Sharing sex toys with an infected person.  Sharing needles with an infected person.  Having intimate contact with the genitals, mouth, or rectal areas of an infected person. SYMPTOMS  Some people may not have any symptoms, but   they can still pass the infection to others. Different STDs have different symptoms. Symptoms include:  Painful or bloody urination.  Pain in the pelvis, abdomen, vagina, anus, throat, or eyes.  Skin rash, itching, irritation, growths, or sores (lesions). These usually occur in the genital or anal area.  Abnormal vaginal discharge.  Penile discharge in men.  Soft, flesh-colored skin growths in the  genital or anal area.  Fever.  Pain or bleeding during sexual intercourse.  Swollen glands in the groin area.  Yellow skin and eyes (jaundice). This is seen with hepatitis. DIAGNOSIS  To make a diagnosis, your caregiver may:  Take a medical history.  Perform a physical exam.  Take a specimen (culture) to be examined.  Examine a sample of discharge under a microscope.  Perform blood test TREATMENT   Chlamydia, gonorrhea, trichomonas, and syphilis can be cured with antibiotic medicine.  Genital herpes, hepatitis, and HIV can be treated, but not cured, with prescribed medicines. The medicines will lessen the symptoms.  Genital warts from HPV can be treated with medicine or by freezing, burning (electrocautery), or surgery. Warts may come back.  HPV is a virus and cannot be cured with medicine or surgery.However, abnormal areas may be followed very closely by your caregiver and may be removed from the cervix, vagina, or vulva through office procedures or surgery. If your diagnosis is confirmed, your recent sexual partners need treatment. This is true even if they are symptom-free or have a negative culture or evaluation. They should not have sex until their caregiver says it is okay. HOME CARE INSTRUCTIONS  All sexual partners should be informed, tested, and treated for all STDs.  Take your antibiotics as directed. Finish them even if you start to feel better.  Only take over-the-counter or prescription medicines for pain, discomfort, or fever as directed by your caregiver.  Rest.  Eat a balanced diet and drink enough fluids to keep your urine clear or pale yellow.  Do not have sex until treatment is completed and you have followed up with your caregiver. STDs should be checked after treatment.  Keep all follow-up appointments, Pap tests, and blood tests as directed by your caregiver.  Only use latex condoms and water-soluble lubricants during sexual activity. Do not use  petroleum jelly or oils.  Avoid alcohol and illegal drugs.  Get vaccinated for HPV and hepatitis. If you have not received these vaccines in the past, talk to your caregiver about whether one or both might be right for you.  Avoid risky sex practices that can break the skin. The only way to avoid getting an STD is to avoid all sexual activity.Latex condoms and dental dams (for oral sex) will help lessen the risk of getting an STD, but will not completely eliminate the risk. SEEK MEDICAL CARE IF:   You have a fever.  You have any new or worsening symptoms. Document Released: 12/25/2002 Document Revised: 12/27/2011 Document Reviewed: 01/01/2011 ExitCare Patient Information 2013 [employer].    Domestic Abuse You are being battered or abused if someone close to you hits, pushes, or physically hurts you in any way. You also are being abused if you are forced into activities. You are being sexually abused if you are forced to have sexual contact of any kind. You are being emotionally abused if you are made to feel worthless or if you are constantly threatened. It is important to remember that help is available. No one has the right to abuse you. PREVENTION OF FURTHER   ABUSE  Learn the warning signs of danger. This varies with situations but may include: the use of alcohol, threats, isolation from friends and family, or forced sexual contact. Leave if you feel that violence is going to occur.  If you are attacked or beaten, report it to the police so the abuse is documented. You do not have to press charges. The police can protect you while you or the attackers are leaving. Get the officer's name and badge number and a copy of the report.  Find someone you can trust and tell them what is happening to you: your caregiver, a nurse, clergy member, close friend or family member. Feeling ashamed is natural, but remember that you have done nothing wrong. No one deserves abuse. Document Released:  10/01/2000 Document Revised: 12/27/2011 Document Reviewed: 12/10/2010 ExitCare Patient Information 2013 [employer].    How Much is Too Much Alcohol? Drinking too much alcohol can cause injury, accidents, and health problems. These types of problems can include:   Car crashes.  Falls.  Family fighting (domestic violence).  Drowning.  Fights.  Injuries.  Burns.  Damage to certain organs.  Having a baby with birth defects. ONE DRINK CAN BE TOO MUCH WHEN YOU ARE:  Working.  Pregnant or breastfeeding.  Taking medicines. Ask your doctor.  Driving or planning to drive. If you or someone you know has a drinking problem, get help from a doctor.  Document Released: 07/31/2009 Document Revised: 12/27/2011 Document Reviewed: 07/31/2009 ExitCare Patient Information 2013 [employer].   Smoking Hazards Smoking cigarettes is extremely bad for your health. Tobacco smoke has over 200 known poisons in it. There are over 60 chemicals in tobacco smoke that cause cancer. Some of the chemicals found in cigarette smoke include:   Cyanide.  Benzene.  Formaldehyde.  Methanol (wood alcohol).  Acetylene (fuel used in welding torches).  Ammonia. Cigarette smoke also contains the poisonous gases nitrogen oxide and carbon monoxide.  Cigarette smokers have an increased risk of many serious medical problems and Smoking causes approximately:  90% of all lung cancer deaths in men.  80% of all lung cancer deaths in women.  90% of deaths from chronic obstructive lung disease. Compared with nonsmokers, smoking increases the risk of:  Coronary heart disease by 2 to 4 times.  Stroke by 2 to 4 times.  Men developing lung cancer by 23 times.  Women developing lung cancer by 13 times.  Dying from chronic obstructive lung diseases by 12 times.  . Smoking is the most preventable cause of death and disease in our society.  WHY IS SMOKING ADDICTIVE?  Nicotine is the chemical  agent in tobacco that is capable of causing addiction or dependence.  When you smoke and inhale, nicotine is absorbed rapidly into the bloodstream through your lungs. Nicotine absorbed through the lungs is capable of creating a powerful addiction. Both inhaled and non-inhaled nicotine may be addictive.  Addiction studies of cigarettes and spit tobacco show that addiction to nicotine occurs mainly during the teen years, when young people begin using tobacco products. WHAT ARE THE BENEFITS OF QUITTING?  There are many health benefits to quitting smoking.   Likelihood of developing cancer and heart disease decreases. Health improvements are seen almost immediately.  Blood pressure, pulse rate, and breathing patterns start returning to normal soon after quitting. QUITTING SMOKING   American Lung Association - 1-800-LUNGUSA  American Cancer Society - 1-800-ACS-2345 Document Released: 11/11/2004 Document Revised: 12/27/2011 Document Reviewed: 07/16/2009 ExitCare Patient Information 2013 [employer].   Stress Management Stress is a state of physical or mental tension that often results from changes in your life or normal routine. Some common causes of stress are:  Death of a loved one.  Injuries or severe illnesses.  Getting fired or changing jobs.  Moving into a new home. Other causes may be:  Sexual problems.  Business or financial losses.  Taking on a large debt.  Regular conflict with someone at home or at work.  Constant tiredness from lack of sleep. It is not just bad things that are stressful. It may be stressful to:  Win the lottery.  Get married.  Buy a new car. The amount of stress that can be easily tolerated varies from person to person. Changes generally cause stress, regardless of the types of change. Too much stress can affect your health. It may lead to physical or emotional problems. Too little stress (boredom) may also become stressful. SUGGESTIONS TO  REDUCE STRESS:  Talk things over with your family and friends. It often is helpful to share your concerns and worries. If you feel your problem is serious, you may want to get help from a professional counselor.  Consider your problems one at a time instead of lumping them all together. Trying to take care of everything at once may seem impossible. List all the things you need to do and then start with the most important one. Set a goal to accomplish 2 or 3 things each day. If you expect to do too many in a single day you will naturally fail, causing you to feel even more stressed.  Do not use alcohol or drugs to relieve stress. Although you may feel better for a short time, they do not remove the problems that caused the stress. They can also be habit forming.  Exercise regularly - at least 3 times per week. Physical exercise can help to relieve that "uptight" feeling and will relax you.  The shortest distance between despair and hope is often a good night's sleep.  Go to bed and get up on time allowing yourself time for appointments without being rushed.  Take a short "time-out" period from any stressful situation that occurs during the day. Close your eyes and take some deep breaths. Starting with the muscles in your face, tense them, hold it for a few seconds, then relax. Repeat this with the muscles in your neck, shoulders, hand, stomach, back and legs.  Take good care of yourself. Eat a balanced diet and get plenty of rest.  Schedule time for having fun. Take a break from your daily routine to relax. HOME CARE INSTRUCTIONS   Call if you feel overwhelmed by your problems and feel you can no longer manage them on your own.  Return immediately if you feel like hurting yourself or someone else. Document Released: 03/30/2001 Document Revised: 12/27/2011 Document Reviewed: 11/20/2007 ExitCare Patient Information 2013 [employer].   

## 2013-05-10 ENCOUNTER — Encounter: Payer: Self-pay | Admitting: Nurse Practitioner

## 2013-05-10 ENCOUNTER — Ambulatory Visit (INDEPENDENT_AMBULATORY_CARE_PROVIDER_SITE_OTHER): Payer: Federal, State, Local not specified - PPO | Admitting: Nurse Practitioner

## 2013-05-10 VITALS — BP 102/58 | HR 72 | Resp 12 | Ht 68.5 in

## 2013-05-10 DIAGNOSIS — L723 Sebaceous cyst: Secondary | ICD-10-CM

## 2013-05-10 NOTE — Progress Notes (Signed)
Subjective:     Patient ID: Crystal [name], female   DOB: 1985/11/25, 27 y.o.   MRN: [id number]  HPI 27 yo Sw Fe presents with a history of recurrent sebaceous cyst on the upper left inner thigh.  She has had similar recurrences through the years and this time flared about a week ago while moving.  She felt the continual rubbing of the thighs has caused the flare.  A few days ago about the size of a golf ball and she applied aloe Vera cream and it has gone down.  No exudate ever expressed but still remains tender.  She has smaller areas around this larger one.  Review of Systems  Constitutional: Negative for fever, chills and fatigue.  Respiratory: Negative.   Cardiovascular: Negative.   Gastrointestinal: Negative.  Negative for abdominal pain.  Genitourinary: Negative.  Negative for dysuria, urgency, frequency, hematuria, flank pain, pelvic pain and dyspareunia.  Musculoskeletal: Negative.   Skin: Positive for wound.       Cystic area right inner thigh - recurrent.  Neurological: Negative.   Psychiatric/Behavioral: Negative.        Objective:   Physical Exam  Constitutional: She is oriented to person, place, and time. She appears well-developed and well-nourished.  Pulmonary/Chest: Effort normal.  Abdominal: Soft. She exhibits no distension and no mass. There is no tenderness. There is no rebound and no guarding.  Musculoskeletal: Normal range of motion.  Neurological: She is alert and oriented to person, place, and time.  Skin: Skin is warm and dry.     Sebaceous cyst right thigh with several areas of cystic lesions. No exudate or erythema. Largest center lesion is about 1 cm size and two other smaller areas are 1/2 cm size.  Psychiatric: She has a normal mood and affect. Her behavior is normal. Judgment and thought content normal.       Assessment:     History of recurrent sebaceous cyst right thigh    Plan:     Will make referral to Dr. Nino Parsley office for evaluation as she  will most likely need I & D of this area to keep from recurrence. Does not need antibiotic therapy at this time.  Advise not to cause any further irritation.

## 2013-05-14 ENCOUNTER — Telehealth: Payer: Self-pay | Admitting: Nurse Practitioner

## 2013-05-14 NOTE — Telephone Encounter (Signed)
Patient calling to find out about her referral to dermatologist .

## 2013-05-15 NOTE — Telephone Encounter (Signed)
Patty's note (not mine) said to refer to Dr. Nino Parsley office.  Not necessary Dr. Danella Deis.  That means anybody in the office is fine.

## 2013-05-15 NOTE — Telephone Encounter (Signed)
Patient notified of appointment with [facility] on July 31st @ 10:30am. With Dr. Terri Piedra. Records faxed to office.

## 2013-05-15 NOTE — Telephone Encounter (Signed)
Called to get appt. With Dr. Campbell Stall. First available appt. With Dr. Danella Deis  Is  06/26/2013. First available appt. With the P.A. Is 06/19/2013. Please advise.

## 2013-05-15 NOTE — Progress Notes (Signed)
Encounter reviewed by Dr. Brook Silva.  

## 2013-09-10 ENCOUNTER — Ambulatory Visit (INDEPENDENT_AMBULATORY_CARE_PROVIDER_SITE_OTHER): Payer: Federal, State, Local not specified - PPO | Admitting: Emergency Medicine

## 2013-09-10 VITALS — BP 120/80 | HR 93 | Temp 98.8°F | Resp 18 | Ht 68.0 in | Wt 234.0 lb

## 2013-09-10 DIAGNOSIS — J018 Other acute sinusitis: Secondary | ICD-10-CM

## 2013-09-10 MED ORDER — PSEUDOEPHEDRINE-GUAIFENESIN ER 60-600 MG PO TB12: 1.0000 | ORAL_TABLET | Freq: Two times a day (BID) | ORAL | Status: DC

## 2013-09-10 MED ORDER — AMOXICILLIN-POT CLAVULANATE 875-125 MG PO TABS: 1.0000 | ORAL_TABLET | Freq: Two times a day (BID) | ORAL | Status: DC

## 2013-09-10 NOTE — Patient Instructions (Signed)

## 2013-09-10 NOTE — Progress Notes (Signed)
[facility] [address] [place] [address] [id number]  Date:  09/10/2013   Name:  Crystal [name]   DOB:  12/27/1985   MRN:  [id number]  PCP:  Sanda Linger, MD    Chief Complaint: Sore Throat, Generalized Body Aches, Nasal Congestion, Headache and Rash   History of Present Illness:  Crystal [name] is a 27 y.o. very pleasant female patient who presents with the following:  Ill this weekend with nasal congestion and drainage.  Purulent in nature.  Sore throat.  Post nasal drip.  Cough that is not productive. No wheezing or shortness of breath.  No nausea or vomiting.  No fever or chills. No improvement with over the counter medications or other home remedies. Denies other complaint or health concern today.   Patient Active Problem List   Diagnosis Date Noted  . Numbness and tingling 08/19/2011  . Anxiety, generalized 08/19/2011  . DEPRESSION 08/06/2009  . ASTHMA 08/06/2009    Past Medical History  Diagnosis Date  . Asthma   . Depression   . Anxiety     Past Surgical History  Procedure Laterality Date  . Cholecystectomy    . Tonsillectomy    . Iud removal      mirena inserted 1/08, removal 11/12  . Wisdom tooth extraction  2007    History  Substance Use Topics  . Smoking status: Former Smoker -- 0.00 packs/day  . Smokeless tobacco: Not on file     Comment: Regular Exercise - Yes  . Alcohol Use: 3.0 oz/week    5 Glasses of wine per week    Family History  Problem Relation Age of Onset  . Cancer Other     Lung  . Anxiety disorder Mother     No Known Allergies  Medication list has been reviewed and updated.  Current Outpatient Prescriptions on File Prior to Visit  Medication Sig Dispense Refill  . Norethindrone Acetate-Ethinyl Estrad-FE (LOMEDIA 24 FE) 1-20 MG-MCG(24) tablet Take 1 tablet by mouth daily.  1 Package  11  . beclomethasone (QVAR) 80 MCG/ACT inhaler Inhale 1 puff into the lungs 2 (two) times daily.  1  Inhaler  12  . clonazePAM (KLONOPIN) 1 MG tablet Take 1 tablet (1 mg total) by mouth 3 (three) times daily.  90 tablet  2  . FLUoxetine (PROZAC) 20 MG capsule Take 1 capsule (20 mg total) by mouth daily.  30 capsule  11  . levalbuterol (XOPENEX HFA) 45 MCG/ACT inhaler Inhale 1-2 puffs into the lungs every 4 (four) hours as needed for wheezing.  1 Inhaler  12  . Multiple Vitamins-Minerals (MULTIVITAMIN PO) Take by mouth daily.       No current facility-administered medications on file prior to visit.    Review of Systems:  As per HPI, otherwise negative.    Physical Examination: Filed Vitals:   09/10/13 1524  BP: 120/80  Pulse: 93  Temp: 98.8 F (37.1 C)  Resp: 18   Filed Vitals:   09/10/13 1524  Height: 5\' 8"  (1.727 m)  Weight: 234 lb (106.142 kg)   Body mass index is 35.59 kg/(m^2). Ideal Body Weight: Weight in (lb) to have BMI = 25: 164.1  GEN: WDWN, NAD, Non-toxic, A & O x 3 HEENT: Atraumatic, Normocephalic. Neck supple. No masses, No LAD. Ears and Nose: No external deformity. CV: RRR, No M/G/R. No JVD. No thrill. No extra heart sounds. PULM: CTA B, no wheezes, crackles, rhonchi. No retractions. No  resp. distress. No accessory muscle use. ABD: S, NT, ND, +BS. No rebound. No HSM. EXTR: No c/c/e NEURO Normal gait.  PSYCH: Normally interactive. Conversant. Not depressed or anxious appearing.  Calm demeanor.    Assessment and Plan: Sinusitis augmentin mucinex d  Signed,  Phillips Odor, MD

## 2013-09-10 NOTE — Addendum Note (Signed)
Addended by: Carmelina Dane on: 09/10/2013 06:56 PM   Modules accepted: Orders

## 2013-10-18 NOTE — L&D Delivery Note (Signed)
Delivery Note  First Stage: Induction with cervical balloon @ 2100 (10/12) Labor onset: 0400 - pitocin initiation Augmentation : pitocin Analgesia /Anesthesia intrapartum: epidural AROM at 0811  Second Stage: Complete dilation at 1525 Onset of pushing at 1610 FHR second stage category 1  Delivery of a viable female at 181626 by CNM in ROA position no nuchal cord Cord double clamped after cessation of pulsation, cut by FOB Cord blood sample collected   Collection of cord blood donation by Cord Blood Staff   Third Stage: placenta delivered Shultz intact with 3 VC @ 1635 placenta disposition: for disposal uterine tone firm / bleeding small  no perineal laceration identified  small left labia abrasion bleeding at top of laceration despite pressure to site - single 4-0 vicryl suture at top of labia for hemostasis  hymenal band separation at introitus - single 4-0 vicryl suture for hemostasis in front of hymenal band  anesthesia for repair: epidural  Est. Blood Loss (mL): 250  Complications: none  Mom to postpartum.  Baby to Couplet care / Skin to Skin.  Newborn: Birth Weight: 8 pounds 15 ounces Apgar Scores: 8-9 Feeding planned: breast  Crystal [name], Izora Benn CNM, MSN, FACNM 07/30/2014, 5:00 PM

## 2013-11-22 ENCOUNTER — Ambulatory Visit (INDEPENDENT_AMBULATORY_CARE_PROVIDER_SITE_OTHER): Payer: 59 | Admitting: Certified Nurse Midwife

## 2013-11-22 ENCOUNTER — Encounter: Payer: Self-pay | Admitting: Certified Nurse Midwife

## 2013-11-22 VITALS — BP 110/70 | HR 72 | Resp 16 | Ht 67.5 in | Wt 237.0 lb

## 2013-11-22 DIAGNOSIS — N912 Amenorrhea, unspecified: Secondary | ICD-10-CM

## 2013-11-22 DIAGNOSIS — Z3201 Encounter for pregnancy test, result positive: Secondary | ICD-10-CM

## 2013-11-22 LAB — POCT URINE PREGNANCY: PREG TEST UR: POSITIVE

## 2013-11-22 NOTE — Patient Instructions (Signed)

## 2013-11-22 NOTE — Progress Notes (Signed)
  28 y.o. Married Caucasian G0P0 female presents with no menses since 10/14/13. Patient was on OCP, but had not been consistent with use and stopped. Denies vaginal bleeding, or pain. Home UPT positive. Spouse with patient today, excited about positive UPT here. Patient on no medication except for multivitamin. Had small amount of alcohol use prior to positive UPT, none since. Happy with news. Denies vomiting,but is having slight nausea and nipple tenderness.Patient walks daily for exercise and eats well balanced diet. Limited caffeine intake. No family history of genetic problems in pregnancy.   Pertinent items are noted in HPI. O: Healthy WDWN female Affect: normal, orientation x 3 AEX 03/18/13 WNL Patient was on previous medication for anxiety with MD.   Assessment:  Amenorrhea with positive UPT EDC 07/21/14 5 weeks 3 days per LMP only  Plan: Discussed importance of prenatal care by 8- 10 weeks. Discussed nutritional needs during pregnancy and foods to avoid. Comfort measures of pregnancy given. Reviewed warning signs and symptoms of early pregnancy and need to advise. Discussed care desired and given referral list. Questions answered at length. Wished well with pregnancy.. Patient to advise when appointment is made so records can be sent.Marland Kitchen.   Rv prn   35 minutes spent with patient with >50% of time spent in face to face counseling.

## 2013-11-23 NOTE — Progress Notes (Signed)
Reviewed personally.  M. Suzanne Daphna Lafuente, MD.  

## 2013-12-13 LAB — OB RESULTS CONSOLE GBS: GBS: NEGATIVE

## 2013-12-13 LAB — OB RESULTS CONSOLE ANTIBODY SCREEN: ANTIBODY SCREEN: NEGATIVE

## 2013-12-13 LAB — OB RESULTS CONSOLE ABO/RH: RH TYPE: POSITIVE

## 2013-12-13 LAB — OB RESULTS CONSOLE HIV ANTIBODY (ROUTINE TESTING): HIV: NONREACTIVE

## 2013-12-13 LAB — OB RESULTS CONSOLE GC/CHLAMYDIA
Chlamydia: NEGATIVE
Gonorrhea: NEGATIVE

## 2013-12-13 LAB — OB RESULTS CONSOLE RUBELLA ANTIBODY, IGM: Rubella: IMMUNE

## 2013-12-13 LAB — OB RESULTS CONSOLE RPR: RPR: NONREACTIVE

## 2013-12-13 LAB — OB RESULTS CONSOLE HEPATITIS B SURFACE ANTIGEN: HEP B S AG: NEGATIVE

## 2014-03-22 ENCOUNTER — Ambulatory Visit: Payer: Federal, State, Local not specified - PPO | Admitting: Certified Nurse Midwife

## 2014-05-15 ENCOUNTER — Encounter: Payer: 59 | Attending: Certified Nurse Midwife

## 2014-05-15 VITALS — Ht 68.0 in | Wt 253.1 lb

## 2014-05-15 DIAGNOSIS — O99810 Abnormal glucose complicating pregnancy: Secondary | ICD-10-CM | POA: Diagnosis not present

## 2014-05-15 DIAGNOSIS — Z713 Dietary counseling and surveillance: Secondary | ICD-10-CM | POA: Diagnosis present

## 2014-05-16 NOTE — Progress Notes (Signed)
  Patient was seen on 05/15/14 for Gestational Diabetes self-management class at the [facility]. The following learning objectives were met by the patient during this course:   States the definition of Gestational Diabetes  States why dietary management is important in controlling blood glucose  Describes the effects of carbohydrates on blood glucose levels  Demonstrates ability to create a balanced meal plan  Demonstrates carbohydrate counting   States when to check blood glucose levels  Demonstrates proper blood glucose monitoring techniques  States the effect of stress and exercise on blood glucose levels  States the importance of limiting caffeine and abstaining from alcohol and smoking  Plan:  Aim for 2 Carb Choices per meal (30 grams) +/- 1 either way for breakfast Aim for 3 Carb Choices per meal (45 grams) +/- 1 either way from lunch and dinner Aim for 1-2 Carbs per snack Begin reading food labels for Total Carbohydrate and sugar grams of foods Consider  increasing your activity level by walking daily as tolerated Begin checking BG before breakfast and 2 hours after first bit of breakfast, lunch and dinner after  as directed by MD  Take medication  as directed by MD  Blood glucose monitor given:  One Touch Ultra Mini Self Monitoring Kit Lot # [id number] X Exp: 02/2015 Blood glucose reading: $RemoveBeforeDE'95mg'RtXCehbrfilzZTO$ /dl  Patient instructed to monitor glucose levels: FBS: 60 - <90 2 hour: <120  Patient received the following handouts:  Nutrition Diabetes and Pregnancy  Carbohydrate Counting List  Meal Planning worksheet  Patient will be seen for follow-up as needed.

## 2014-05-18 ENCOUNTER — Inpatient Hospital Stay (HOSPITAL_COMMUNITY)
Admission: AD | Admit: 2014-05-18 | Payer: Federal, State, Local not specified - PPO | Source: Ambulatory Visit | Admitting: Obstetrics & Gynecology

## 2014-07-11 LAB — OB RESULTS CONSOLE GBS: GBS: POSITIVE

## 2014-07-20 ENCOUNTER — Encounter (HOSPITAL_COMMUNITY): Payer: Self-pay | Admitting: *Deleted

## 2014-07-20 ENCOUNTER — Inpatient Hospital Stay (HOSPITAL_COMMUNITY)
Admission: AD | Admit: 2014-07-20 | Discharge: 2014-07-20 | Disposition: A | Discharge status: Home / Self Care | Payer: 59 | Source: Ambulatory Visit | Attending: Obstetrics & Gynecology | Admitting: Obstetrics & Gynecology

## 2014-07-20 DIAGNOSIS — O471 False labor at or after 37 completed weeks of gestation: Secondary | ICD-10-CM

## 2014-07-20 DIAGNOSIS — Z87891 Personal history of nicotine dependence: Secondary | ICD-10-CM | POA: Insufficient documentation

## 2014-07-20 DIAGNOSIS — R109 Unspecified abdominal pain: Secondary | ICD-10-CM | POA: Diagnosis present

## 2014-07-20 DIAGNOSIS — Z3A38 38 weeks gestation of pregnancy: Secondary | ICD-10-CM | POA: Diagnosis not present

## 2014-07-20 NOTE — Discharge Instructions (Signed)
Braxton Hicks Contractions °Contractions of the uterus can occur throughout pregnancy. Contractions are not always a sign that you are in labor.  °WHAT ARE BRAXTON HICKS CONTRACTIONS?  °Contractions that occur before labor are called Braxton Hicks contractions, or false labor. Toward the end of pregnancy (32-34 weeks), these contractions can develop more often and may become more forceful. This is not true labor because these contractions do not result in opening (dilatation) and thinning of the cervix. They are sometimes difficult to tell apart from true labor because these contractions can be forceful and people have different pain tolerances. You should not feel embarrassed if you go to the hospital with false labor. Sometimes, the only way to tell if you are in true labor is for your health care provider to look for changes in the cervix. °If there are no prenatal problems or other health problems associated with the pregnancy, it is completely safe to be sent home with false labor and await the onset of true labor. °HOW CAN YOU TELL THE DIFFERENCE BETWEEN TRUE AND FALSE LABOR? °False Labor °· The contractions of false labor are usually shorter and not as hard as those of true labor.   °· The contractions are usually irregular.   °· The contractions are often felt in the front of the lower abdomen and in the groin.   °· The contractions may go away when you walk around or change positions while lying down.   °· The contractions get weaker and are shorter lasting as time goes on.   °· The contractions do not usually become progressively stronger, regular, and closer together as with true labor.   °True Labor °· Contractions in true labor last 30-70 seconds, become very regular, usually become more intense, and increase in frequency.   °· The contractions do not go away with walking.   °· The discomfort is usually felt in the top of the uterus and spreads to the lower abdomen and low back.   °· True labor can be  determined by your health care provider with an exam. This will show that the cervix is dilating and getting thinner.   °WHAT TO REMEMBER °· Keep up with your usual exercises and follow other instructions given by your health care provider.   °· Take medicines as directed by your health care provider.   °· Keep your regular prenatal appointments.   °· Eat and drink lightly if you think you are going into labor.   °· If Braxton Hicks contractions are making you uncomfortable:   °¨ Change your position from lying down or resting to walking, or from walking to resting.   °¨ Sit and rest in a tub of warm water.   °¨ Drink 2-3 glasses of water. Dehydration may cause these contractions.   °¨ Do slow and deep breathing several times an hour.   °WHEN SHOULD I SEEK IMMEDIATE MEDICAL CARE? °Seek immediate medical care if: °· Your contractions become stronger, more regular, and closer together.   °· You have fluid leaking or gushing from your vagina.   °· You have a fever.   °· You pass blood-tinged mucus.   °· You have vaginal bleeding.   °· You have continuous abdominal pain.   °· You have low back pain that you never had before.   °· You feel your baby's head pushing down and causing pelvic pressure.   °· Your baby is not moving as much as it used to.   °Document Released: 10/04/2005 Document Revised: 10/09/2013 Document Reviewed: 07/16/2013 °ExitCare® Patient Information ©2015 [employer]. This information is not intended to replace advice given to you by your health care   provider. Make sure you discuss any questions you have with your health care provider. ° °

## 2014-07-20 NOTE — MAU Note (Signed)
PT  SAYS SHE STARTED HURTING   BAD  AT 2PM.    SHE CALLED  TANYA, CNM    3PM, 430PM , 515PM.    VE IN OFFICE -  ON Thursday-   2CM.    DDENIES HSV AND MRSA.  GBS- NEG.

## 2014-07-20 NOTE — MAU Provider Note (Signed)
  History     CSN: [id number]  Arrival date and time: 07/20/14 1747 Nurse notification @ 1800 Provider in to see patient @ 1845   No chief complaint on file.  HPI  Ctx all day with cramping No LOF or bleeding + mucus plug all day  Past Medical History  Diagnosis Date  . Asthma   . Depression   . Anxiety   . Gestational diabetes mellitus, antepartum    Past Surgical History  Procedure Laterality Date  . Cholecystectomy    . Tonsillectomy    . Iud removal      mirena inserted 1/08, removal 11/12  . Wisdom tooth extraction  2007   Family History  Problem Relation Age of Onset  . Cancer Other     Lung  . Hyperlipidemia Other   . Hypertension Other   . Diabetes Other   . COPD Other   . Heart disease Other   . Anxiety disorder Mother    History  Substance Use Topics  . Smoking status: Former Smoker -- 0.00 packs/day  . Smokeless tobacco: Not on file     Comment: Regular Exercise - Yes  . Alcohol Use: No   Allergies: No Known Allergies  Prescriptions prior to admission  Medication Sig Dispense Refill  . albuterol (PROVENTIL) (2.5 MG/3ML) 0.083% nebulizer solution Take 2.5 mg by nebulization every 6 (six) hours as needed for wheezing or shortness of breath.      . ferrous sulfate 325 (65 FE) MG tablet Take 325 mg by mouth 1 day or 1 dose.      . metFORMIN (GLUCOPHAGE) 500 MG tablet Take 500 mg by mouth 2 (two) times daily with a meal.      . Multiple Vitamins-Minerals (MULTIVITAMIN PO) Take by mouth daily.      . pseudoephedrine (SUDAFED) 30 MG tablet Take 30 mg by mouth every 4 (four) hours as needed for congestion.      Tery Sanfilippo. Docusate Calcium (STOOL SOFTENER PO) Take by mouth as needed.       ROS Physical Exam   Blood pressure 138/89, pulse 95, temperature 98.4 F (36.9 C), temperature source Oral, resp. rate 20, height 5\' 7"  (1.702 m), weight 119.976 kg (264 lb 8 oz), last menstrual period 10/14/2013.  Physical Exam Alert and oriented NAD or PIH  symptoms Abdomen soft and non-tender / gravid uterus, non-tender VE : 2cm / posterior / 60% / vtx / -1 Intact membranes  MAU Course  Procedures  NST - reactive  145 / moderate variability / +accels / no decels Ctx every 8-10 minutes mild  Assessment and Plan  38.4 weeks Braxton-hicks ctx - prodromal ctx pattern No evidence of onset of labor  1) DC home 2) rest tonight - increase water hydration 3) labor and PIH precautions to call 4) ROB at [facility] Monday as scheduled  Marlinda MikeBAILEY, Crystal [name] 07/20/2014, 6:48 PM

## 2014-07-21 NOTE — MAU Provider Note (Signed)
Reviewed and agree with note and plan. V.Lossie Kalp, MD  

## 2014-07-24 ENCOUNTER — Encounter (HOSPITAL_COMMUNITY): Payer: Self-pay | Admitting: *Deleted

## 2014-07-24 ENCOUNTER — Telehealth (HOSPITAL_COMMUNITY): Payer: Self-pay | Admitting: *Deleted

## 2014-07-24 NOTE — Telephone Encounter (Signed)
Preadmission screen  

## 2014-07-29 ENCOUNTER — Inpatient Hospital Stay (HOSPITAL_COMMUNITY)
Admission: RE | Admit: 2014-07-29 | Discharge: 2014-08-01 | DRG: 775 | Disposition: A | Discharge status: Home / Self Care | Payer: 59 | Source: Ambulatory Visit | Attending: Obstetrics and Gynecology | Admitting: Obstetrics and Gynecology

## 2014-07-29 ENCOUNTER — Encounter (HOSPITAL_COMMUNITY): Payer: Self-pay

## 2014-07-29 DIAGNOSIS — Z833 Family history of diabetes mellitus: Secondary | ICD-10-CM

## 2014-07-29 DIAGNOSIS — Z87891 Personal history of nicotine dependence: Secondary | ICD-10-CM | POA: Diagnosis not present

## 2014-07-29 DIAGNOSIS — Z825 Family history of asthma and other chronic lower respiratory diseases: Secondary | ICD-10-CM | POA: Diagnosis not present

## 2014-07-29 DIAGNOSIS — Z6841 Body Mass Index (BMI) 40.0 and over, adult: Secondary | ICD-10-CM

## 2014-07-29 DIAGNOSIS — Z3A39 39 weeks gestation of pregnancy: Secondary | ICD-10-CM | POA: Diagnosis present

## 2014-07-29 DIAGNOSIS — O99824 Streptococcus B carrier state complicating childbirth: Secondary | ICD-10-CM | POA: Diagnosis present

## 2014-07-29 DIAGNOSIS — D509 Iron deficiency anemia, unspecified: Secondary | ICD-10-CM | POA: Diagnosis present

## 2014-07-29 DIAGNOSIS — Z8249 Family history of ischemic heart disease and other diseases of the circulatory system: Secondary | ICD-10-CM | POA: Diagnosis not present

## 2014-07-29 DIAGNOSIS — O99214 Obesity complicating childbirth: Secondary | ICD-10-CM | POA: Diagnosis present

## 2014-07-29 DIAGNOSIS — F418 Other specified anxiety disorders: Secondary | ICD-10-CM | POA: Diagnosis present

## 2014-07-29 DIAGNOSIS — O9902 Anemia complicating childbirth: Secondary | ICD-10-CM | POA: Diagnosis present

## 2014-07-29 DIAGNOSIS — J45909 Unspecified asthma, uncomplicated: Secondary | ICD-10-CM | POA: Diagnosis present

## 2014-07-29 DIAGNOSIS — O99513 Diseases of the respiratory system complicating pregnancy, third trimester: Secondary | ICD-10-CM | POA: Diagnosis present

## 2014-07-29 DIAGNOSIS — O24419 Gestational diabetes mellitus in pregnancy, unspecified control: Secondary | ICD-10-CM

## 2014-07-29 DIAGNOSIS — O24420 Gestational diabetes mellitus in childbirth, diet controlled: Secondary | ICD-10-CM | POA: Diagnosis present

## 2014-07-29 DIAGNOSIS — O3663X0 Maternal care for excessive fetal growth, third trimester, not applicable or unspecified: Secondary | ICD-10-CM | POA: Diagnosis present

## 2014-07-29 DIAGNOSIS — O99343 Other mental disorders complicating pregnancy, third trimester: Secondary | ICD-10-CM | POA: Diagnosis present

## 2014-07-29 LAB — CBC
HCT: 32.2 % — ABNORMAL LOW (ref 36.0–46.0)
Hemoglobin: 10.6 g/dL — ABNORMAL LOW (ref 12.0–15.0)
MCH: 27.2 pg (ref 26.0–34.0)
MCHC: 32.9 g/dL (ref 30.0–36.0)
MCV: 82.8 fL (ref 78.0–100.0)
Platelets: 238 10*3/uL (ref 150–400)
RBC: 3.89 MIL/uL (ref 3.87–5.11)
RDW: 14.5 % (ref 11.5–15.5)
WBC: 10.3 10*3/uL (ref 4.0–10.5)

## 2014-07-29 LAB — TYPE AND SCREEN
ABO/RH(D): O POS
Antibody Screen: NEGATIVE

## 2014-07-29 MED ORDER — PENICILLIN G POTASSIUM 5000000 UNITS IJ SOLR
5.0000 10*6.[IU] | Freq: Once | INTRAMUSCULAR | Status: AC
  Administered 2014-07-29: 5 10*6.[IU] via INTRAVENOUS
  Filled 2014-07-29: qty 5

## 2014-07-29 MED ORDER — OXYTOCIN 40 UNITS IN LACTATED RINGERS INFUSION - SIMPLE MED: 62.5000 mL/h | INTRAVENOUS | Status: DC

## 2014-07-29 MED ORDER — SODIUM CHLORIDE 0.9 % IJ SOLN: 3.0000 mL | INTRAMUSCULAR | Status: DC | PRN

## 2014-07-29 MED ORDER — OXYTOCIN 40 UNITS IN LACTATED RINGERS INFUSION - SIMPLE MED
1.0000 m[IU]/min | INTRAVENOUS | Status: DC
Start: 2014-07-29 — End: 2014-07-30
  Administered 2014-07-30: 1 m[IU]/min via INTRAVENOUS
  Filled 2014-07-29: qty 1000

## 2014-07-29 MED ORDER — LIDOCAINE HCL (PF) 1 % IJ SOLN
30.0000 mL | INTRAMUSCULAR | Status: DC | PRN
  Filled 2014-07-29: qty 30

## 2014-07-29 MED ORDER — SODIUM CHLORIDE 0.9 % IV SOLN
250.0000 mL | INTRAVENOUS | Status: DC | PRN
Start: 2014-07-29 — End: 2014-07-30

## 2014-07-29 MED ORDER — OXYCODONE-ACETAMINOPHEN 5-325 MG PO TABS: 2.0000 | ORAL_TABLET | ORAL | Status: DC | PRN

## 2014-07-29 MED ORDER — ACETAMINOPHEN 325 MG PO TABS
650.0000 mg | ORAL_TABLET | ORAL | Status: DC | PRN
Start: 2014-07-29 — End: 2014-07-30

## 2014-07-29 MED ORDER — ZOLPIDEM TARTRATE 5 MG PO TABS
5.0000 mg | ORAL_TABLET | Freq: Once | ORAL | Status: DC
  Filled 2014-07-29: qty 1

## 2014-07-29 MED ORDER — METFORMIN HCL 500 MG PO TABS
500.0000 mg | ORAL_TABLET | Freq: Every day | ORAL | Status: DC
  Administered 2014-07-29: 500 mg via ORAL
  Filled 2014-07-29 (×2): qty 1

## 2014-07-29 MED ORDER — OXYTOCIN BOLUS FROM INFUSION: 500.0000 mL | INTRAVENOUS | Status: DC

## 2014-07-29 MED ORDER — LACTATED RINGERS IV SOLN
INTRAVENOUS | Status: DC
  Administered 2014-07-29 – 2014-07-30 (×3): via INTRAVENOUS

## 2014-07-29 MED ORDER — OXYCODONE-ACETAMINOPHEN 5-325 MG PO TABS: 1.0000 | ORAL_TABLET | ORAL | Status: DC | PRN

## 2014-07-29 MED ORDER — CITRIC ACID-SODIUM CITRATE 334-500 MG/5ML PO SOLN: 30.0000 mL | ORAL | Status: DC | PRN

## 2014-07-29 MED ORDER — MISOPROSTOL 25 MCG QUARTER TABLET: 25.0000 ug | ORAL_TABLET | Freq: Once | ORAL | Status: DC

## 2014-07-29 MED ORDER — ONDANSETRON HCL 4 MG/2ML IJ SOLN
4.0000 mg | Freq: Four times a day (QID) | INTRAMUSCULAR | Status: DC | PRN
  Administered 2014-07-30: 4 mg via INTRAVENOUS
  Filled 2014-07-29: qty 2

## 2014-07-29 MED ORDER — DEXTROSE 5 % IV SOLN
2.5000 10*6.[IU] | INTRAVENOUS | Status: DC
  Administered 2014-07-30 (×4): 2.5 10*6.[IU] via INTRAVENOUS
  Filled 2014-07-29 (×8): qty 2.5

## 2014-07-29 MED ORDER — LACTATED RINGERS IV SOLN: 500.0000 mL | INTRAVENOUS | Status: DC | PRN

## 2014-07-29 MED ORDER — TERBUTALINE SULFATE 1 MG/ML IJ SOLN: 0.2500 mg | Freq: Once | INTRAMUSCULAR | Status: AC | PRN

## 2014-07-29 MED ORDER — SODIUM CHLORIDE 0.9 % IJ SOLN: 3.0000 mL | Freq: Two times a day (BID) | INTRAMUSCULAR | Status: DC

## 2014-07-29 NOTE — H&P (Signed)
OB ADMISSION/ HISTORY & PHYSICAL:  Admission Date: 07/29/2014  7:20 PM  Admit Diagnosis: 39.6 weeks / GDMa2  Crystal [name] is a 28 y.o. female presenting for induction of labor.  Prenatal History: G1P0000   EDC : 07/30/2014, by Other Basis  [facility]  Primary Ob Provider: Marlinda Mikeanya Alyssa Mancera CNM Prenatal course complicated by asthma / IDA of pregnancy / GDMa2  Prenatal Labs: ABO, Rh: O/Positive/-- (02/26 0000) Antibody: Negative (02/26 0000) Rubella: Immune (02/26 0000)  RPR: Nonreactive (02/26 0000)  HBsAg: Negative (02/26 0000)  HIV: Non-reactive (02/26 0000)  GTT: ABNORMAL GBS: Positive (09/24 0000)   EFW @ 8-8 1/2 pounds by [employer]  Medical / Surgical History :  Past medical history:  Past Medical History  Diagnosis Date  . Depression   . Anxiety   . Gestational diabetes mellitus, antepartum   . Hx of varicella   . Headache   . Anemia   . Gestational diabetes     metformin  . Asthma     inhaler used 2-3/week     Past surgical history:  Past Surgical History  Procedure Laterality Date  . Cholecystectomy    . Tonsillectomy    . Iud removal      mirena inserted 1/08, removal 11/12  . Wisdom tooth extraction  2007    Family History:  Family History  Problem Relation Age of Onset  . Hyperlipidemia Other   . Hypertension Other   . Diabetes Other   . COPD Other   . Heart disease Other   . Hypertension Father   . Thyroid disease Maternal Grandmother   . Hypertension Maternal Grandfather   . Heart disease Maternal Grandfather   . Cancer Paternal Grandfather     lung     Social History:  reports that she has quit smoking. She has never used smokeless tobacco. She reports that she does not drink alcohol or use illicit drugs.   Allergies: Review of patient's allergies indicates no known allergies.    Current Medications at time of admission:  Prior to Admission medications   Medication Sig Start Date End Date  Taking? Authorizing Provider  albuterol (PROVENTIL) (2.5 MG/3ML) 0.083% nebulizer solution Take 2.5 mg by nebulization every 6 (six) hours as needed for wheezing or shortness of breath.    Historical Provider, MD  Docusate Calcium (STOOL SOFTENER PO) Take by mouth as needed.    Historical Provider, MD  ferrous sulfate 325 (65 FE) MG tablet Take 325 mg by mouth 1 day or 1 dose.    Historical Provider, MD  metFORMIN (GLUCOPHAGE) 500 MG tablet Take 500 mg by mouth 2 (two) times daily with a meal.    Historical Provider, MD  Multiple Vitamins-Minerals (MULTIVITAMIN PO) Take by mouth daily.    Historical Provider, MD  pseudoephedrine (SUDAFED) 30 MG tablet Take 30 mg by mouth every 4 (four) hours as needed for congestion.    Historical Provider, MD   Review of Systems: Active FM Rare CTX - mild No LOF bloody show - absent  Physical Exam:  VS: Blood pressure 143/89, pulse 76, temperature 98.1 F (36.7 C), temperature source Oral, resp. rate 18, height 5\' 8"  (1.727 m), weight 119.75 kg (264 lb), last menstrual period 10/14/2013.  General: alert and oriented, appears comfortable / NAD or pain Heart: RRR Lungs: Clear lung fields Abdomen: Gravid, soft and non-tender, non-distended / uterus: gravid and non-tender Extremities: trace  edema  Genitalia / VE: Dilation: 2 Effacement (%):  70 Station: -2 Exam by:: [employer]iley CNM  FHR: baseline rate 135 / variability moderate / accelerations + / no decelerations TOCO: rare ctx x 30 seconds mild  Assessment:  39.[redacted] weeks gestation Induction of labor - GDMa2 FHR category 1  Plan:  Admit PCN prophylaxis for (+) GBS Cytotec 25mcg per vagina Cervical balloon placement  Dr Cherly Hensenousins notified of admission / plan of care   Marlinda MikeBAILEY, Bairon Klemann CNM, MSN, [facility] 07/29/2014, 8:09 PM

## 2014-07-29 NOTE — Progress Notes (Signed)
S:  Understands plans of care - cytotec and balloon tonight      O:  VS: Blood pressure 143/89, pulse 76, temperature 98.1 F (36.7 C), temperature source Oral, resp. rate 18, height 5\' 8"  (1.727 m), weight 119.75 kg (264 lb), last menstrual period 10/14/2013.        FHR : baseline 145 / variability moderate / accelerations + / no decelerations        Toco: contractions every rare        Cervix : 2/70 / vtx -2        Membranes: intact  A: induction of labor - GDM a2     FHR category 1  P: cytotec and cervical balloon      traction to balloon every 2 hours      ambien for rest - encouraged to try to sleep in early stage of induction      Metformin 500 mg tonight - hold in labor      PCN for GBS   Marlinda MikeBAILEY, Castiel Lauricella CNM, MSN, [facility] 07/29/2014, 9:00 PM

## 2014-07-30 ENCOUNTER — Encounter (HOSPITAL_COMMUNITY): Payer: 59 | Admitting: Anesthesiology

## 2014-07-30 ENCOUNTER — Inpatient Hospital Stay (HOSPITAL_COMMUNITY): Payer: 59 | Admitting: Anesthesiology

## 2014-07-30 ENCOUNTER — Encounter (HOSPITAL_COMMUNITY): Payer: Self-pay

## 2014-07-30 LAB — ABO/RH: ABO/RH(D): O POS

## 2014-07-30 LAB — RPR

## 2014-07-30 LAB — GLUCOSE, CAPILLARY: Glucose-Capillary: 101 mg/dL — ABNORMAL HIGH (ref 70–99)

## 2014-07-30 MED ORDER — PHENYLEPHRINE 40 MCG/ML (10ML) SYRINGE FOR IV PUSH (FOR BLOOD PRESSURE SUPPORT)
80.0000 ug | PREFILLED_SYRINGE | INTRAVENOUS | Status: DC | PRN
  Filled 2014-07-30: qty 2

## 2014-07-30 MED ORDER — EPHEDRINE 5 MG/ML INJ
10.0000 mg | INTRAVENOUS | Status: DC | PRN
  Filled 2014-07-30: qty 2

## 2014-07-30 MED ORDER — SIMETHICONE 80 MG PO CHEW: 80.0000 mg | CHEWABLE_TABLET | ORAL | Status: DC | PRN

## 2014-07-30 MED ORDER — LACTATED RINGERS IV BOLUS (SEPSIS): 300.0000 mL | Freq: Once | INTRAVENOUS | Status: DC

## 2014-07-30 MED ORDER — DIPHENHYDRAMINE HCL 25 MG PO CAPS: 25.0000 mg | ORAL_CAPSULE | Freq: Four times a day (QID) | ORAL | Status: DC | PRN

## 2014-07-30 MED ORDER — IBUPROFEN 600 MG PO TABS
600.0000 mg | ORAL_TABLET | Freq: Four times a day (QID) | ORAL | Status: DC
  Administered 2014-07-30 – 2014-08-01 (×8): 600 mg via ORAL
  Filled 2014-07-30 (×8): qty 1

## 2014-07-30 MED ORDER — DIPHENHYDRAMINE HCL 50 MG/ML IJ SOLN: 12.5000 mg | INTRAMUSCULAR | Status: DC | PRN

## 2014-07-30 MED ORDER — WITCH HAZEL-GLYCERIN EX PADS: 1.0000 "application " | MEDICATED_PAD | CUTANEOUS | Status: DC | PRN

## 2014-07-30 MED ORDER — SENNOSIDES-DOCUSATE SODIUM 8.6-50 MG PO TABS
2.0000 | ORAL_TABLET | ORAL | Status: DC
Start: 2014-07-31 — End: 2014-08-01
  Administered 2014-07-31 (×2): 1 via ORAL
  Administered 2014-08-01: 2 via ORAL
  Filled 2014-07-30: qty 1
  Filled 2014-07-30 (×2): qty 2

## 2014-07-30 MED ORDER — FENTANYL 2.5 MCG/ML BUPIVACAINE 1/10 % EPIDURAL INFUSION (WH - ANES)
INTRAMUSCULAR | Status: DC | PRN
  Administered 2014-07-30: 14 mL/h via EPIDURAL

## 2014-07-30 MED ORDER — DIBUCAINE 1 % RE OINT: 1.0000 "application " | TOPICAL_OINTMENT | RECTAL | Status: DC | PRN

## 2014-07-30 MED ORDER — OXYTOCIN 40 UNITS IN LACTATED RINGERS INFUSION - SIMPLE MED: 1.0000 m[IU]/min | INTRAVENOUS | Status: DC

## 2014-07-30 MED ORDER — LIDOCAINE HCL (PF) 1 % IJ SOLN
INTRAMUSCULAR | Status: DC | PRN
  Administered 2014-07-30: 9 mL
  Administered 2014-07-30: 8 mL

## 2014-07-30 MED ORDER — FENTANYL 2.5 MCG/ML BUPIVACAINE 1/10 % EPIDURAL INFUSION (WH - ANES)
14.0000 mL/h | INTRAMUSCULAR | Status: DC | PRN
  Administered 2014-07-30 (×2): 14 mL/h via EPIDURAL
  Filled 2014-07-30 (×2): qty 125

## 2014-07-30 MED ORDER — LANOLIN HYDROUS EX OINT: TOPICAL_OINTMENT | CUTANEOUS | Status: DC | PRN

## 2014-07-30 MED ORDER — LACTATED RINGERS IV SOLN
500.0000 mL | Freq: Once | INTRAVENOUS | Status: AC
  Administered 2014-07-30: 500 mL via INTRAVENOUS

## 2014-07-30 MED ORDER — OXYCODONE-ACETAMINOPHEN 5-325 MG PO TABS
1.0000 | ORAL_TABLET | ORAL | Status: DC | PRN
  Administered 2014-07-31 – 2014-08-01 (×5): 1 via ORAL
  Filled 2014-07-30 (×5): qty 1

## 2014-07-30 MED ORDER — PHENYLEPHRINE 40 MCG/ML (10ML) SYRINGE FOR IV PUSH (FOR BLOOD PRESSURE SUPPORT)
80.0000 ug | PREFILLED_SYRINGE | INTRAVENOUS | Status: DC | PRN
  Filled 2014-07-30: qty 2
  Filled 2014-07-30: qty 10

## 2014-07-30 MED ORDER — BENZOCAINE-MENTHOL 20-0.5 % EX AERO
1.0000 "application " | INHALATION_SPRAY | CUTANEOUS | Status: DC | PRN
  Administered 2014-08-01: 1 via TOPICAL
  Filled 2014-07-30: qty 56

## 2014-07-30 NOTE — Progress Notes (Addendum)
TC to WilburAmanda, Charity fundraiserN for update on patient / orders to recheck cervix / cervical exam "much different from last check", cervix is 4.5-5/70/-2 and "not as soft as last time" / pitocin on 5mU  Pitocin dose increased to 2mU.  Raelyn MoraAWSON, Kaiyden Simkin, M MSN, CNM 07/30/2014, 6:38 AM

## 2014-07-30 NOTE — Plan of Care (Signed)
Problem: Phase I Progression Outcomes Goal: Pain controlled with appropriate interventions Outcome: Progressing Pt received epidural

## 2014-07-30 NOTE — Progress Notes (Addendum)
TC from Tarry KosAmanda Farmer, RN reporting cervical balloon fell out / cx 6/70/-2, very posterior / UC's every 2-3 mins / orders to start pitocin 1mU given.  Raelyn MoraAWSON, Celesta Funderburk, M MSN, CNM 07/29/2014, 11:35 PM

## 2014-07-30 NOTE — Progress Notes (Signed)
S:  Resting at intervals since epidural - pressure with ctx  O:  VS: Blood pressure 110/63, pulse 66, temperature 98.1 F (36.7 C), temperature source Oral, resp. rate 20, height 5\' 8"  (1.727 m), weight 119.75 kg (264 lb), last menstrual period 10/14/2013.        FHR : baseline 150 / variability moderate / accelerations 10x10 / no decelerations        Toco: contractions every 2 minutes / pitocin 7 mu/min / MVU >200        Cervix : 10 / 100% / vtx +3        Membranes: clear with show  A: active labor     FHR category 1  P: start active second stage - anticipate SVB in next 2 hours   Marlinda MikeBAILEY, Jacquelynne Guedes CNM, MSN, [facility] 07/30/2014, 3:32 PM

## 2014-07-30 NOTE — Anesthesia Preprocedure Evaluation (Addendum)
Anesthesia Evaluation  Patient identified by MRN, date of birth, ID band Patient awake    Reviewed: Allergy & Precautions, H&P , NPO status , Patient's Chart, lab work & pertinent test results  Airway Mallampati: II TM Distance: >3 FB Neck ROM: full    Dental no notable dental hx.    Pulmonary former smoker,    Pulmonary exam normal       Cardiovascular negative cardio ROS      Neuro/Psych negative psych ROS   GI/Hepatic negative GI ROS, Neg liver ROS,   Endo/Other  diabetes, Well Controlled, Gestational, Oral Hypoglycemic AgentsMorbid obesity  Renal/GU negative Renal ROS     Musculoskeletal   Abdominal (+) + obese,   Peds  Hematology negative hematology ROS (+)   Anesthesia Other Findings   Reproductive/Obstetrics (+) Pregnancy                          Anesthesia Physical Anesthesia Plan  ASA: III  Anesthesia Plan: Epidural   Post-op Pain Management:    Induction:   Airway Management Planned:   Additional Equipment:   Intra-op Plan:   Post-operative Plan:   Informed Consent: I have reviewed the patients History and Physical, chart, labs and discussed the procedure including the risks, benefits and alternatives for the proposed anesthesia with the patient or authorized representative who has indicated his/her understanding and acceptance.     Plan Discussed with:   Anesthesia Plan Comments:         Anesthesia Quick Evaluation

## 2014-07-30 NOTE — Anesthesia Procedure Notes (Signed)
Epidural Patient location during procedure: OB Start time: 07/30/2014 9:20 AM End time: 07/30/2014 9:24 AM  Staffing Anesthesiologist: Leilani AbleHATCHETT, Sanyia Dini Performed by: anesthesiologist   Preanesthetic Checklist Completed: patient identified, surgical consent, pre-op evaluation, timeout performed, IV checked, risks and benefits discussed and monitors and equipment checked  Epidural Patient position: sitting Prep: site prepped and draped and DuraPrep Patient monitoring: continuous pulse ox and blood pressure Approach: midline Location: L3-L4 Injection technique: LOR air  Needle:  Needle type: Tuohy  Needle gauge: 17 G Needle length: 9 cm and 9 Needle insertion depth: 7 cm Catheter type: closed end flexible Catheter size: 19 Gauge Catheter at skin depth: 12 cm Test dose: negative and Other  Assessment Sensory level: T9 Events: blood not aspirated, injection not painful, no injection resistance, negative IV test and no paresthesia  Additional Notes Reason for block:procedure for pain

## 2014-07-30 NOTE — Progress Notes (Signed)
S:  Pressure with ctx - no pain since epidural  O:  VS: Blood pressure 150/93, pulse 84, temperature 98.3 F (36.8 C), temperature source Oral, resp. rate 20, height 5\' 8"  (1.727 m), weight 119.75 kg (264 lb), last menstrual period 10/14/2013.        FHR : baseline 140 / variability moderate / accelerations + / no decelerations        Toco: contractions every 2-4 minutes / mild  / pitocin 7 mu/min        Cervix : 6/95%/vtx 0 station with small caput        IUPC placed        Membranes: clear fluid / small show        Foley - dark amber color ~ 400ml  A: active labor     FHR category 1  P: LR 300 Ml bolus      titrate pitocin PRN to maintain adequate MVUs      bolus epidural dose for pain control - reposition far lateral    Marlinda MikeBAILEY, TANYA CNM, MSN, FACNM 07/30/2014, 11:24 AM

## 2014-07-30 NOTE — Progress Notes (Signed)
S:  ctx painful - breathing well with ctx / up ad lib / labored in shower-tub for past hour  O:  VS: Blood pressure 137/84, pulse 78, temperature 98.3 F (36.8 C), temperature source Oral, resp. rate 20, height 5\' 8"  (1.727 m), weight 119.75 kg (264 lb), last menstrual period 10/14/2013.        FHR : baseline 148 / variability moderate / accelerations + / no decelerations        Toco: contractions every 2-3 minutes / pitocin @ 377mu/min        Cervix : 6cm / posterior / 90% / vtx / -1 / BBOW        Membranes: AROM - clear        PCN x 3 doses infused  A: active labor     FHR category 1  P: desires epidural - place epidural      titrate pitocin after epidural to maintain adequate ctx   Marlinda MikeBAILEY, TANYA CNM, MSN, [facility]ACNM 07/30/2014, 8:25 AM

## 2014-07-31 LAB — GLUCOSE, CAPILLARY: Glucose-Capillary: 114 mg/dL — ABNORMAL HIGH (ref 70–99)

## 2014-07-31 LAB — CBC
HCT: 29.1 % — ABNORMAL LOW (ref 36.0–46.0)
Hemoglobin: 9.6 g/dL — ABNORMAL LOW (ref 12.0–15.0)
MCH: 27.4 pg (ref 26.0–34.0)
MCHC: 33 g/dL (ref 30.0–36.0)
MCV: 83.1 fL (ref 78.0–100.0)
Platelets: 186 10*3/uL (ref 150–400)
RBC: 3.5 MIL/uL — ABNORMAL LOW (ref 3.87–5.11)
RDW: 14.5 % (ref 11.5–15.5)
WBC: 11.4 10*3/uL — ABNORMAL HIGH (ref 4.0–10.5)

## 2014-07-31 MED ORDER — INFLUENZA VAC SPLIT QUAD 0.5 ML IM SUSY: 0.5000 mL | PREFILLED_SYRINGE | INTRAMUSCULAR | Status: DC

## 2014-07-31 MED ORDER — MAGNESIUM OXIDE 400 (241.3 MG) MG PO TABS
200.0000 mg | ORAL_TABLET | Freq: Every day | ORAL | Status: DC
  Administered 2014-07-31 – 2014-08-01 (×2): 200 mg via ORAL
  Filled 2014-07-31 (×2): qty 0.5

## 2014-07-31 MED ORDER — POLYSACCHARIDE IRON COMPLEX 150 MG PO CAPS
150.0000 mg | ORAL_CAPSULE | Freq: Two times a day (BID) | ORAL | Status: DC
  Administered 2014-07-31 – 2014-08-01 (×3): 150 mg via ORAL
  Filled 2014-07-31 (×3): qty 1

## 2014-07-31 NOTE — Progress Notes (Signed)
PPD #1- SVD  Subjective:   Reports feeling well Tolerating po/ No nausea or vomiting Bleeding is light Pain controlled with Motrin and Percocet Up ad lib / ambulatory / voiding without problems Newborn: breastfeeding    Objective:   VS:  VS:  Filed Vitals:   07/30/14 1815 07/30/14 1845 07/30/14 1945 07/30/14 2345  BP: 124/86 137/85 141/76 129/60  Pulse: 78 85 85 80  Temp:  98.7 F (37.1 C) 97.5 F (36.4 C) 98.2 F (36.8 C)  TempSrc:  Oral Oral Oral  Resp: 20 18 18 18   Height:      Weight:      SpO2:   99% 97%    LABS:  Recent Labs  07/29/14 2145 07/31/14 0615  WBC 10.3 11.4*  HGB 10.6* 9.6*  PLT 238 186   Blood type: --/--/O POS, O POS (10/12 2145) Rubella: Immune (02/26 0000)   I&O: Intake/Output     10/13 0701 - 10/14 0700 10/14 0701 - 10/15 0700   Urine (mL/kg/hr) 2000 (0.7)    Blood 250 (0.1)    Total Output 2250     Net -2250          Urine Occurrence 1 x     BS-115 (random) Physical Exam: Alert and oriented x3 Abdomen: soft, non-tender, non-distended  Fundus: firm, non-tender, U-2 Perineum: intact Lochia: small Extremities: No edema, no calf pain or tenderness    Assessment:  PPD #1 G1P1001/ S/P:induced vaginal A2GDM, delivered IDA with compounding ABL anemia  Doing well    Plan: FBS in am Continue routine post partum orders Anticipate D/C home tomorrow   Crystal [name], Crystal [name], N MSN, CNM 07/31/2014, 12:52 PM

## 2014-07-31 NOTE — Progress Notes (Signed)
CSW acknowledges consult.  CSW attempted on 2 occassions to meet with the [name].  On the first attempt, the photographer was in the room taking pictures of the baby.  On the second attempt, [name] had numerous visitors.   CSW to re-attempt on 10/15.

## 2014-07-31 NOTE — Lactation Note (Signed)
This note was copied from the chart of Crystal [name]. Lactation Consultation Note Initial consultation; baby 6322 hours old, mom holding STS, baby sleeping. Mom states breastfeeding is going well so far, states baby has fed several times, denies nipple pain.  Discussed br feeding basics, reviewed baby and me book, lactation brochure, community resources, answered questions. Enc mom to continue frequent STS and cue based feeding, and to call for help if needed.   Patient Name: Crystal Micheline Mazeaylor Kensinger WGNFA'OToday's Date: 07/31/2014 Reason for consult: Initial assessment   Maternal Data Has patient been taught Hand Expression?: Yes Does the patient have breastfeeding experience prior to this delivery?: No  Feeding    LATCH Score/Interventions                      Lactation Tools Discussed/Used     Consult Status Consult Status: Follow-up Follow-up type: In-patient    Octavio MannsSanders, Seydina Holliman [facility] 07/31/2014, 3:31 PM

## 2014-07-31 NOTE — Anesthesia Postprocedure Evaluation (Signed)
  Anesthesia Post-op Note  Patient: Crystal [name]  Procedure(s) Performed: * No procedures listed *  Patient Location: Mother/Baby  Anesthesia Type:Epidural  Level of Consciousness: awake, alert , oriented and patient cooperative  Airway and Oxygen Therapy: Patient Spontanous Breathing  Post-op Pain: mild  Post-op Assessment: Patient's Cardiovascular Status Stable, Respiratory Function Stable, No headache, No backache, No residual numbness and No residual motor weakness  Post-op Vital Signs: stable  Last Vitals:  Filed Vitals:   07/30/14 2345  BP: 129/60  Pulse: 80  Temp: 36.8 C  Resp: 18    Complications: No apparent anesthesia complications

## 2014-08-01 LAB — GLUCOSE, CAPILLARY: Glucose-Capillary: 87 mg/dL (ref 70–99)

## 2014-08-01 MED ORDER — IBUPROFEN 600 MG PO TABS: 600.0000 mg | ORAL_TABLET | Freq: Four times a day (QID) | ORAL | Status: DC

## 2014-08-01 MED ORDER — MAGNESIUM OXIDE 400 (241.3 MG) MG PO TABS
200.0000 mg | ORAL_TABLET | Freq: Every day | ORAL | Status: DC
Start: 2014-08-01 — End: 2015-09-30

## 2014-08-01 MED ORDER — OXYCODONE-ACETAMINOPHEN 5-325 MG PO TABS: 1.0000 | ORAL_TABLET | ORAL | Status: DC | PRN

## 2014-08-01 MED ORDER — POLYSACCHARIDE IRON COMPLEX 150 MG PO CAPS: 150.0000 mg | ORAL_CAPSULE | Freq: Two times a day (BID) | ORAL | Status: DC

## 2014-08-01 NOTE — Progress Notes (Signed)
PPD #2 - SVD  Subjective:   Reports feeling well Tolerating po/ No nausea or vomiting Bleeding is light Pain controlled with Motrin and Percocet Up ad lib / ambulatory / voiding without problems Newborn: breastfeeding    Objective:   VS:  VS:  Filed Vitals:   07/30/14 2345 07/31/14 0745 07/31/14 1740 08/01/14 0602  BP: 129/60 131/64 130/75 125/65  Pulse: 80 81 82 72  Temp: 98.2 F (36.8 C) 98.1 F (36.7 C) 97.3 F (36.3 C) 97.9 F (36.6 C)  TempSrc: Oral Oral Oral Oral  Resp: 18 18 18 18   Height:      Weight:      SpO2: 97%   98%    LABS:   Recent Labs  07/29/14 2145 07/31/14 0615  WBC 10.3 11.4*  HGB 10.6* 9.6*  PLT 238 186   Blood type: --/--/O POS, O POS (10/12 2145) Rubella: Immune (02/26 0000)   I&O: Intake/Output     10/14 0701 - 10/15 0700 10/15 0701 - 10/16 0700   Urine (mL/kg/hr)     Blood     Total Output       Net             BS-115 (random) Physical Exam: Alert and oriented x3 Abdomen: soft, non-tender, non-distended  Fundus: firm, non-tender, U-2 Perineum: intact Lochia: small Extremities: No edema, no calf pain or tenderness    Assessment:  PPD #2 / G1P1001 / S/P:induced vaginal A2GDM, delivered - stable IDA with compounding ABL anemia   Doing well    Plan: Continue routine post partum orders D/C Home today  Monitor FBS x 1 wk / report to Marlinda Mikeanya Bailey, CNM   Kenard GowerAWSON, Bijou Easler, M MSN, CNM 08/01/2014, 11:10 AM

## 2014-08-01 NOTE — Discharge Summary (Signed)
Obstetric Discharge Summary  Reason for Admission: Pt is a G1P1001 at 1129w0d IOL for GDM A2 and suspected LGA  [facility] 8[facility], with Marlinda Mikeanya Bailey, CNM as primary provider.  Medications on Admission: Prescriptions prior to admission  Medication Sig Dispense Refill  . acetaminophen (TYLENOL) 325 MG tablet Take 650 mg by mouth every 6 (six) hours as needed for mild pain or headache.      . albuterol (PROVENTIL HFA;VENTOLIN HFA) 108 (90 BASE) MCG/ACT inhaler Inhale 2 puffs into the lungs every 6 (six) hours as needed for wheezing or shortness of breath.      . Prenatal Vit-Fe Fumarate-FA (PRENATAL MULTIVITAMIN) TABS tablet Take 1 tablet by mouth daily.      . [DISCONTINUED] ferrous sulfate 325 (65 FE) MG tablet Take 325 mg by mouth daily.       . [DISCONTINUED] metFORMIN (GLUCOPHAGE) 500 MG tablet Take 500 mg by mouth 2 (two) times daily with a meal.      . [DISCONTINUED] pseudoephedrine (SUDAFED) 30 MG tablet Take 30 mg by mouth every 4 (four) hours as needed for congestion.        Prenatal Labs: ABO, Rh: O POS (10/12 2145)  Antibody: NEG (10/12 2145) Rubella: Immune (02/26 0000)  RPR: NON REAC (10/12 2145)  HBsAg: Negative (02/26 0000)  HIV: Non-reactive (02/26 0000)  GTT : abnormal 1 hr and 3 hr GTT GBS: Positive (09/24 0000)   Prenatal Procedures: NST and ultrasound Intrapartum Course: Admitted for IOL for GDMA2 and suspected LGA / Cytotec, cervical balloon placed and pitocin for IOL / normal progression to complete dilation / SVD of viable female with labia abrasion and hymenal tear repair by Marlinda Mikeanya Bailey, CNM / no immediate postpartum complications noted Intrapartum Procedures: spontaneous vaginal delivery Postpartum Procedures: none Complications-Operative and Postpartum: Lt labia abrasion repair  Labs: Hemoglobin  Date Value Ref Range Status  07/31/2014 9.6* 12.0 - 15.0 g/dL Final     HCT  Date Value Ref Range Status  07/31/2014  29.1* 36.0 - 46.0 % Final   Lab Results  Component Value Date   PLT 186 07/31/2014    Newborn Data: Live born female on 07/30/2014 Birth Weight: 8 lb 15 oz (4055 g) APGAR: 8, 9  Home with mother.   Discharge Information: Date: 08/01/2014 Discharge Diagnoses:  Pt is a G1P1001 at 329w0d S/P Term Pregnancy-delivered and GDMA2- delivered on 07/30/2014  Condition: stable Activity: pelvic rest Diet: routine, check FBS x 1 wk and report to Marlinda Mikeanya Bailey, CNM Medications:    Medication List    STOP taking these medications       ferrous sulfate 325 (65 FE) MG tablet     metFORMIN 500 MG tablet  Commonly known as:  GLUCOPHAGE     pseudoephedrine 30 MG tablet  Commonly known as:  SUDAFED      TAKE these medications       acetaminophen 325 MG tablet  Commonly known as:  TYLENOL  Take 650 mg by mouth every 6 (six) hours as needed for mild pain or headache.     albuterol 108 (90 BASE) MCG/ACT inhaler  Commonly known as:  PROVENTIL HFA;VENTOLIN HFA  Inhale 2 puffs into the lungs every 6 (six) hours as needed for wheezing or shortness of breath.     ibuprofen 600 MG tablet  Commonly known as:  ADVIL,MOTRIN  Take 1 tablet (600 mg total) by mouth every 6 (six) hours.     iron  polysaccharides 150 MG capsule  Commonly known as:  NIFEREX  Take 1 capsule (150 mg total) by mouth 2 (two) times daily.     magnesium oxide 400 (241.3 MG) MG tablet  Commonly known as:  MAG-OX  Take 0.5 tablets (200 mg total) by mouth daily.     oxyCODONE-acetaminophen 5-325 MG per tablet  Commonly known as:  PERCOCET/ROXICET  Take 1 tablet by mouth every 4 (four) hours as needed (for pain scale less than 7).     prenatal multivitamin Tabs tablet  Take 1 tablet by mouth daily.       Instructions: [facility] and reviewed Discharge to: home     Follow-up Information   Follow up with Marlinda MikeBAILEY, TANYA, CNM. Schedule an appointment as soon as possible for  a visit in 6 weeks. (postpartum visit)    Specialty:  Obstetrics and Gynecology   Contact information:   [address] [place] [id number] [phone]       Raelyn MoraAWSON, ROLITTA, Judie PetitM, MSN, CNM 08/01/2014, 11:16 AM

## 2014-08-01 NOTE — Lactation Note (Signed)
This note was copied from the chart of Crystal Micheline Mazeaylor Haskett. Lactation Consultation Note  Patient Name: Crystal [name] HYQMV'HToday's Date: 08/01/2014 Reason for consult: Follow-up assessment  Visited with Mom and FOB on day of discharge.  Mom states baby breast feeding well.  Baby at breast while in sidelying position.  Latch score 9, as Mom had striped bruise on left nipple from earlier latch difficulty.  Comfort Gels given with instructions on care and use. Reminded Mom of importance of skin to skin, and cue based frequent feedings.  Engorgement prevention and treatment discussed.  Reminded Mom of OP Lactation services available.  Encouraged her to call prn.   Consult Status Consult Status: Complete Date: 08/01/14 Follow-up type: Call as needed    Judee ClaraSmith, Preethi Scantlebury E 08/01/2014, 9:08 AM

## 2014-08-01 NOTE — Discharge Instructions (Signed)
Breast Pumping Tips °If you are breastfeeding, there may be times when you cannot feed your baby directly. Returning to work or going on a trip are common examples. Pumping allows you to store breast milk and feed it to your baby later.  °You may not get much milk when you first start to pump. Your breasts should start to make more after a few days. If you pump at the times you usually feed your baby, you may be able to keep making enough milk to feed your baby without also using formula. The more often you pump, the more milk you will produce.  °WHEN SHOULD I PUMP?  °· You can begin to pump soon after delivery. However, some experts recommend waiting about 4 weeks before giving your infant a bottle to make sure breastfeeding is going well.  °· If you plan to return to work, begin pumping a few weeks before. This will help you develop techniques that work best for you. It also lets you build up a supply of breast milk.   °· When you are with your infant, feed on demand and pump after each feeding.   °· When you are away from your infant for several hours, pump for about 15 minutes every 2-3 hours. Pump both breasts at the same time if you can.   °· If your infant has a formula feeding, make sure to pump around the same time.     °· If you drink any alcohol, wait 2 hours before pumping.   °HOW DO I PREPARE TO PUMP? °Your let-down reflex is the natural reaction to stimulation that makes your breast milk flow. It is easier to stimulate this reflex when you are relaxed. Find relaxation techniques that work for you. If you have difficulty with your let-down reflex, try these methods:  °· Smell one of your infant's blankets or an item of clothing.   °· Look at a picture or video of your infant.   °· Sit in a quiet, private space.   °· Massage the breast you plan to pump.   °· Place soothing warmth on the breast.   °· Play relaxing music.   °WHAT ARE SOME GENERAL BREAST PUMPING TIPS? °· Wash your hands before you pump. You  do not need to wash your nipples or breasts. °· There are three ways to pump. °¨ You can use your hand to massage and compress your breast. °¨ You can use a handheld manual pump. °¨ You can use an electric pump.   °· Make sure the suction cup (flange) on the breast pump is the right size. Place the flange directly over the nipple. If it is the wrong size or placed the wrong way, it may be painful and cause nipple damage.   °· If pumping is uncomfortable, apply a small amount of purified or modified lanolin to your nipple and areola. °· If you are using an electric pump, adjust the speed and suction power to be more comfortable. °· If pumping is painful or if you are not getting very much milk, you may need a different type of pump. A lactation consultant can help you determine what type of pump to use.   °· Keep a full water bottle near you at all times. Drinking lots of fluid helps you make more milk.  °· You can store your milk to use later. Pumped breast milk can be stored in a sealable, sterile container or plastic bag. Label all stored breast milk with the date you pumped it. °¨ Milk can stay out at room temperature for up to 8 hours. °¨   You can store your milk in the refrigerator for up to 8 days. °¨ You can store your milk in the freezer for 3 months. Thaw frozen milk using warm water. Do not put it in the microwave. °· Do not smoke. Smoking can lower your milk supply and harm your infant. If you need help quitting, ask your health care provider to recommend a program.   °WHEN SHOULD I CALL MY HEALTH CARE PROVIDER OR A LACTATION CONSULTANT? °· You are having trouble pumping. °· You are concerned that you are not making enough milk. °· You have nipple pain, soreness, or redness. °· You want to use birth control. Birth control pills may lower your milk supply. Talk to your health care provider about your options. °Document Released: 03/24/2010 Document Revised: 10/09/2013 Document Reviewed:  07/27/2013 °Exit[employer]® Patient Information ©2015 [employer]. This information is not intended to replace advice given to you by your health care provider. Make sure you discuss any questions you have with your health care provider. ° °Nutrition for the New Mother  °A new mother needs good health and nutrition so she can have energy to take care of a new baby. Whether a mother breastfeeds or formula feeds the baby, it is important to have a well-balanced diet. Foods from all the food groups should be chosen to meet the new mother's energy needs and to give her the nutrients needed for repair and healing.  °A HEALTHY EATING PLAN °The My [employer] plan for Moms outlines what you should eat to help you and your baby stay healthy. The energy and amount of food you need depends on whether or not you are breastfeeding. If you are breastfeeding you will need more nutrients. If you choose not to breastfeed, your nutrition goal should be to return to a healthy weight. Limiting calories may be needed if you are not breastfeeding.  °HOME CARE INSTRUCTIONS  °· For a personal plan based on your unique needs, see your Registered Dietitian or visit www.mypyramid.gov. °· Eat a variety of foods. The plan below will help guide you. The following chart has a suggested daily meal plan from the [employer] for Moms. °· Eat a variety of fruits and vegetables. °· Eat more dark green and orange vegetables and cooked dried beans. °· Make half your grains whole grains. Choose whole instead of refined grains. °· Choose low-fat or lean meats and poultry. °· Choose low-fat or fat-free dairy products like milk, cheese, or yogurt. °Fruits °· Breastfeeding: 2 cups °· Non-Breastfeeding: 2 cups °· What Counts as a serving? °¨ 1 cup of fruit or juice. °¨ ½ cup dried fruit. °Vegetables °· Breastfeeding: 3 cups °· Non-Breastfeeding: 2 ½ cups °· What Counts as a serving? °¨ 1 cup raw or cooked vegetables. °¨ Juice or 2 cups raw leafy  vegetables. °Grains °· Breastfeeding: 8 oz °· Non-Breastfeeding: 6 oz °· What Counts as a serving? °¨ 1 slice bread. °¨ 1 oz ready-to-eat cereal. °¨ ½ cup cooked pasta, rice, or cereal. °Meat and Beans °· Breastfeeding: 6 ½ oz °· Non-Breastfeeding: 5 ½ oz °· What Counts as a serving? °¨ 1 oz lean meat, poultry, or fish °¨ ¼ cup cooked dry beans °¨ ½ oz nuts or 1 egg °¨ 1 tbs peanut butter °Milk °· Breastfeeding: 3 cups °· Non-Breastfeeding: 3 cups °· What Counts as a serving? °¨ 1 cup milk. °¨ 8 oz yogurt. °¨ 1 ½ oz cheese. °¨ 2 oz processed cheese. °TIPS FOR THE BREASTFEEDING MOM °· Rapid weight   loss is not suggested when you are breastfeeding. By simply breastfeeding, you will be able to lose the weight gained during your pregnancy. Your caregiver can keep track of your weight and tell you if your weight loss is appropriate. °· Be sure to drink fluids. You may notice that you are thirstier than usual. A suggestion is to drink a glass of water or other beverage whenever you breastfeed. °· Avoid alcohol as it can be passed into your breast milk. °· Limit caffeine drinks to no more than 2 to 3 cups per day. °· You may need to keep taking your prenatal vitamin while you are breastfeeding. Talk with your caregiver about taking a vitamin or supplement. °RETURING TO A HEALTHY WEIGHT °· The My [employer] Plan for Moms will help you return to a healthy weight. It will also provide the nutrients you need. °· You may need to limit "empty" calories. These include: °¨ High fat foods like fried foods, fatty meats, fast food, butter, and mayonnaise. °¨ High sugar foods like sodas, jelly, candy, and sweets. °· Be physically active. Include 30 minutes of exercise or more each day. Choose an activity you like such as walking, swimming, biking, or aerobics. Check with your caregiver before you start to exercise. °Document Released: 01/11/2008 Document Revised: 12/27/2011 Document Reviewed: 01/11/2008 °ExitCare® Patient Information  ©2015 [employer]. This information is not intended to replace advice given to you by your health care provider. Make sure you discuss any questions you have with your health care provider. °Postpartum Depression and Baby Blues °The postpartum period begins right after the birth of a baby. During this time, there is often a great amount of joy and excitement. It is also a time of many changes in the life of the parents. Regardless of how many times a mother gives birth, each child brings new challenges and dynamics to the family. It is not unusual to have feelings of excitement along with confusing shifts in moods, emotions, and thoughts. All mothers are at risk of developing postpartum depression or the "baby blues." These mood changes can occur right after giving birth, or they may occur many months after giving birth. The baby blues or postpartum depression can be mild or severe. Additionally, postpartum depression can go away rather quickly, or it can be a long-term condition.  °CAUSES °Raised hormone levels and the rapid drop in those levels are thought to be a main cause of postpartum depression and the baby blues. A number of hormones change during and after pregnancy. Estrogen and progesterone usually decrease right after the delivery of your baby. The levels of thyroid hormone and various cortisol steroids also rapidly drop. Other factors that play a role in these mood changes include major life events and genetics.  °RISK FACTORS °If you have any of the following risks for the baby blues or postpartum depression, know what symptoms to watch out for during the postpartum period. Risk factors that may increase the likelihood of getting the baby blues or postpartum depression include: °· Having a personal or family history of depression.   °· Having depression while being pregnant.   °· Having premenstrual mood issues or mood issues related to oral contraceptives. °· Having a lot of life stress.   °· Having  marital conflict.   °· Lacking a social support network.   °· Having a baby with special needs.   °· Having health problems, such as diabetes.   °SIGNS AND SYMPTOMS °Symptoms of baby blues include: °· Brief changes in mood, such as going   from extreme happiness to sadness. °· Decreased concentration.   °· Difficulty sleeping.   °· Crying spells, tearfulness.   °· Irritability.   °· Anxiety.   °Symptoms of postpartum depression typically begin within the first month after giving birth. These symptoms include: °· Difficulty sleeping or excessive sleepiness.   °· Marked weight loss.   °· Agitation.   °· Feelings of worthlessness.   °· Lack of interest in activity or food.   °Postpartum psychosis is a very serious condition and can be dangerous. Fortunately, it is rare. Displaying any of the following symptoms is cause for immediate medical attention. Symptoms of postpartum psychosis include:  °· Hallucinations and delusions.   °· Bizarre or disorganized behavior.   °· Confusion or disorientation.   °DIAGNOSIS  °A diagnosis is made by an evaluation of your symptoms. There are no medical or lab tests that lead to a diagnosis, but there are various questionnaires that a health care provider may use to identify those with the baby blues, postpartum depression, or psychosis. Often, a screening tool called the Edinburgh Postnatal Depression Scale is used to diagnose depression in the postpartum period.  °TREATMENT °The baby blues usually goes away on its own in 1-2 weeks. Social support is often all that is needed. You will be encouraged to get adequate sleep and rest. Occasionally, you may be given medicines to help you sleep.  °Postpartum depression requires treatment because it can last several months or longer if it is not treated. Treatment may include individual or group therapy, medicine, or both to address any social, physiological, and psychological factors that may play a role in the depression. Regular exercise, a  healthy diet, rest, and social support may also be strongly recommended.  °Postpartum psychosis is more serious and needs treatment right away. Hospitalization is often needed. °HOME CARE INSTRUCTIONS °· Get as much rest as you can. Nap when the baby sleeps.   °· Exercise regularly. Some women find yoga and walking to be beneficial.   °· Eat a balanced and nourishing diet.   °· Do little things that you enjoy. Have a cup of tea, take a bubble bath, read your favorite magazine, or listen to your favorite music. °· Avoid alcohol.   °· Ask for help with household chores, cooking, grocery shopping, or running errands as needed. Do not try to do everything.   °· Talk to people close to you about how you are feeling. Get support from your partner, family members, friends, or other new moms. °· Try to stay positive in how you think. Think about the things you are grateful for.   °· Do not spend a lot of time alone.   °· Only take over-the-counter or prescription medicine as directed by your health care provider. °· Keep all your postpartum appointments.   °· Let your health care provider know if you have any concerns.   °SEEK MEDICAL CARE IF: °You are having a reaction to or problems with your medicine. °SEEK IMMEDIATE MEDICAL CARE IF: °· You have suicidal feelings.   °· You think you may harm the baby or someone else. °MAKE SURE YOU: °· Understand these instructions. °· Will watch your condition. °· Will get help right away if you are not doing well or get worse. °Document Released: 07/08/2004 Document Revised: 10/09/2013 Document Reviewed: 07/16/2013 °Exit[employer]® Patient Information ©2015 [employer]. This information is not intended to replace advice given to you by your health care provider. Make sure you discuss any questions you have with your health care provider. °Breastfeeding and Mastitis °Mastitis is inflammation of the breast tissue. It can occur in women who   are breastfeeding. This can make breastfeeding  painful. Mastitis will sometimes go away on its own. Your health care provider will help determine if treatment is needed. °CAUSES °Mastitis is often associated with a blocked milk (lactiferous) duct. This can happen when too much milk builds up in the breast. Causes of excess milk in the breast can include: °· Poor latch-on. If your baby is not latched onto the breast properly, she or he may not empty your breast completely while breastfeeding. °· Allowing too much time to pass between feedings. °· Wearing a bra or other clothing that is too tight. This puts extra pressure on the lactiferous ducts so milk does not flow through them as it should. °Mastitis can also be caused by a bacterial infection. Bacteria may enter the breast tissue through cuts or openings in the skin. In women who are breastfeeding, this may occur because of cracked or irritated skin. Cracks in the skin are often caused when your baby does not latch on properly to the breast. °SIGNS AND SYMPTOMS °· Swelling, redness, tenderness, and pain in an area of the breast. °· Swelling of the glands under the arm on the same side. °· Fever may or may not accompany mastitis. °If an infection is allowed to progress, a collection of pus (abscess) may develop. °DIAGNOSIS  °Your health care provider can usually diagnose mastitis based on your symptoms and a physical exam. Tests may be done to help confirm the diagnosis. These may include: °· Removal of pus from the breast by applying pressure to the area. This pus can be examined in the lab to determine which bacteria are present. If an abscess has developed, the fluid in the abscess can be removed with a needle. This can also be used to confirm the diagnosis and determine the bacteria present. In most cases, pus will not be present. °· Blood tests to determine if your body is fighting a bacterial infection. °· Mammogram or ultrasound tests to rule out other problems or diseases. °TREATMENT  °Mastitis that  occurs with breastfeeding will sometimes go away on its own. Your health care provider may choose to wait 24 hours after first seeing you to decide whether a prescription medicine is needed. If your symptoms are worse after 24 hours, your health care provider will likely prescribe an antibiotic medicine to treat the mastitis. He or she will determine which bacteria are most likely causing the infection and will then select an appropriate antibiotic medicine. This is sometimes changed based on the results of tests performed to identify the bacteria, or if there is no response to the antibiotic medicine selected. Antibiotic medicines are usually given by mouth. You may also be given medicine for pain. °HOME CARE INSTRUCTIONS °· Only take over-the-counter or prescription medicines for pain, fever, or discomfort as directed by your health care provider. °· If your health care provider prescribed an antibiotic medicine, take the medicine as directed. Make sure you finish it even if you start to feel better. °· Do not wear a tight or underwire bra. Wear a soft, supportive bra. °· Increase your fluid intake, especially if you have a fever. °· Continue to empty the breast. Your health care provider can tell you whether this milk is safe for your infant or needs to be thrown out. You may be told to stop nursing until your health care provider thinks it is safe for your baby. Use a breast pump if you are advised to stop nursing. °· Keep your nipples   clean and dry. °· Empty the first breast completely before going to the other breast. If your baby is not emptying your breasts completely for some reason, use a breast pump to empty your breasts. °· If you go back to work, pump your breasts while at work to stay in time with your nursing schedule. °· Avoid allowing your breasts to become overly filled with milk (engorged). °SEEK MEDICAL CARE IF: °· You have pus-like discharge from the breast. °· Your symptoms do not improve with  the treatment prescribed by your health care provider within 2 days. °SEEK IMMEDIATE MEDICAL CARE IF: °· Your pain and swelling are getting worse. °· You have pain that is not controlled with medicine. °· You have a red line extending from the breast toward your armpit. °· You have a fever or persistent symptoms for more than 2-3 days. °· You have a fever and your symptoms suddenly get worse. °MAKE SURE YOU:  °· Understand these instructions. °· Will watch your condition. °· Will get help right away if you are not doing well or get worse. °Document Released: 01/29/2005 Document Revised: 10/09/2013 Document Reviewed: 05/10/2013 °[employer]® Patient Information ©2015 [employer]. This information is not intended to replace advice given to you by your health care provider. Make sure you discuss any questions you have with your health care provider. °Breastfeeding °Deciding to breastfeed is one of the best choices you can make for you and your baby. A change in hormones during pregnancy causes your breast tissue to grow and increases the number and size of your milk ducts. These hormones also allow proteins, sugars, and fats from your blood supply to make breast milk in your milk-producing glands. Hormones prevent breast milk from being released before your baby is born as well as prompt milk flow after birth. Once breastfeeding has begun, thoughts of your baby, as well as his or her sucking or crying, can stimulate the release of milk from your milk-producing glands.  °BENEFITS OF BREASTFEEDING °For Your Baby °· Your first milk (colostrum) helps your baby's digestive system function better.   °· There are antibodies in your milk that help your baby fight off infections.   °· Your baby has a lower incidence of asthma, allergies, and sudden infant death syndrome.   °· The nutrients in breast milk are better for your baby than infant formulas and are designed uniquely for your baby's needs.   °· Breast milk improves your  baby's brain development.   °· Your baby is less likely to develop other conditions, such as childhood obesity, asthma, or type 2 diabetes mellitus.   °For You  °· Breastfeeding helps to create a very special bond between you and your baby.   °· Breastfeeding is convenient. Breast milk is always available at the correct temperature and costs nothing.   °· Breastfeeding helps to burn calories and helps you lose the weight gained during pregnancy.   °· Breastfeeding makes your uterus contract to its prepregnancy size faster and slows bleeding (lochia) after you give birth.   °· Breastfeeding helps to lower your risk of developing type 2 diabetes mellitus, osteoporosis, and breast or ovarian cancer later in life. °SIGNS THAT YOUR BABY IS HUNGRY °Early Signs of Hunger  °· Increased alertness or activity. °· Stretching. °· Movement of the head from side to side. °· Movement of the head and opening of the mouth when the corner of the mouth or cheek is stroked (rooting). °· Increased sucking sounds, smacking lips, cooing, sighing, or squeaking. °· Hand-to-mouth movements. °· Increased sucking of   fingers or hands. °Late Signs of Hunger °· Fussing. °· Intermittent crying. °Extreme Signs of Hunger °Signs of extreme hunger will require calming and consoling before your baby will be able to breastfeed successfully. Do not wait for the following signs of extreme hunger to occur before you initiate breastfeeding:   °· Restlessness. °· A loud, strong cry. °·  Screaming. °BREASTFEEDING BASICS °Breastfeeding Initiation °· Find a comfortable place to sit or lie down, with your neck and back well supported. °· Place a pillow or rolled up blanket under your baby to bring him or her to the level of your breast (if you are seated). Nursing pillows are specially designed to help support your arms and your baby while you breastfeed. °· Make sure that your baby's abdomen is facing your abdomen.   °· Gently massage your breast. With your  fingertips, massage from your chest wall toward your nipple in a circular motion. This encourages milk flow. You may need to continue this action during the feeding if your milk flows slowly. °· Support your breast with 4 fingers underneath and your thumb above your nipple. Make sure your fingers are well away from your nipple and your baby's mouth.   °· Stroke your baby's lips gently with your finger or nipple.   °· When your baby's mouth is open wide enough, quickly bring your baby to your breast, placing your entire nipple and as much of the colored area around your nipple (areola) as possible into your baby's mouth.   °¨ More areola should be visible above your baby's upper lip than below the lower lip.   °¨ Your baby's tongue should be between his or her lower gum and your breast.   °· Ensure that your baby's mouth is correctly positioned around your nipple (latched). Your baby's lips should create a seal on your breast and be turned out (everted). °· It is common for your baby to suck about 2-3 minutes in order to start the flow of breast milk. °Latching °Teaching your baby how to latch on to your breast properly is very important. An improper latch can cause nipple pain and decreased milk supply for you and poor weight gain in your baby. Also, if your baby is not latched onto your nipple properly, he or she may swallow some air during feeding. This can make your baby fussy. Burping your baby when you switch breasts during the feeding can help to get rid of the air. However, teaching your baby to latch on properly is still the best way to prevent fussiness from swallowing air while breastfeeding. °Signs that your baby has successfully latched on to your nipple:    °· Silent tugging or silent sucking, without causing you pain.   °· Swallowing heard between every 3-4 sucks.   °·  Muscle movement above and in front of his or her ears while sucking.   °Signs that your baby has not successfully latched on to  nipple:  °· Sucking sounds or smacking sounds from your baby while breastfeeding. °· Nipple pain. °If you think your baby has not latched on correctly, slip your finger into the corner of your baby's mouth to break the suction and place it between your baby's gums. Attempt breastfeeding initiation again. °Signs of Successful Breastfeeding °Signs from your baby:   °· A gradual decrease in the number of sucks or complete cessation of sucking.   °· Falling asleep.   °· Relaxation of his or her body.   °· Retention of a small amount of milk in his or her mouth.   °· Letting go   of your breast by himself or herself. °Signs from you: °· Breasts that have increased in firmness, weight, and size 1-3 hours after feeding.   °· Breasts that are softer immediately after breastfeeding. °· Increased milk volume, as well as a change in milk consistency and color by the fifth day of breastfeeding.   °· Nipples that are not sore, cracked, or bleeding. °Signs That Your Baby is Getting Enough Milk °· Wetting at least 3 diapers in a 24-hour period. The urine should be clear and pale yellow by age 5 days. °· At least 3 stools in a 24-hour period by age 5 days. The stool should be soft and yellow. °· At least 3 stools in a 24-hour period by age 7 days. The stool should be seedy and yellow. °· No loss of weight greater than 10% of birth weight during the first 3 days of age. °· Average weight gain of 4-7 ounces (113-198 g) per week after age 4 days. °· Consistent daily weight gain by age 5 days, without weight loss after the age of 2 weeks. °After a feeding, your baby may spit up a small amount. This is common. °BREASTFEEDING FREQUENCY AND DURATION °Frequent feeding will help you make more milk and can prevent sore nipples and breast engorgement. Breastfeed when you feel the need to reduce the fullness of your breasts or when your baby shows signs of hunger. This is called "breastfeeding on demand." Avoid introducing a pacifier to your  baby while you are working to establish breastfeeding (the first 4-6 weeks after your baby is born). After this time you may choose to use a pacifier. Research has shown that pacifier use during the first year of a baby's life decreases the risk of sudden infant death syndrome (SIDS). °Allow your baby to feed on each breast as long as he or she wants. Breastfeed until your baby is finished feeding. When your baby unlatches or falls asleep while feeding from the first breast, offer the second breast. Because newborns are often sleepy in the first few weeks of life, you may need to awaken your baby to get him or her to feed. °Breastfeeding times will vary from baby to baby. However, the following rules can serve as a guide to help you ensure that your baby is properly fed: °· Newborns (babies 4 weeks of age or younger) may breastfeed every 1-3 hours. °· Newborns should not go longer than 3 hours during the day or 5 hours during the night without breastfeeding. °· You should breastfeed your baby a minimum of 8 times in a 24-hour period until you begin to introduce solid foods to your baby at around 6 months of age. °BREAST MILK PUMPING °Pumping and storing breast milk allows you to ensure that your baby is exclusively fed your breast milk, even at times when you are unable to breastfeed. This is especially important if you are going back to work while you are still breastfeeding or when you are not able to be present during feedings. Your lactation consultant can give you guidelines on how long it is safe to store breast milk.  °A breast pump is a machine that allows you to pump milk from your breast into a sterile bottle. The pumped breast milk can then be stored in a refrigerator or freezer. Some breast pumps are operated by hand, while others use electricity. Ask your lactation consultant which type will work best for you. Breast pumps can be purchased, but some hospitals and breastfeeding support groups   lease  breast pumps on a monthly basis. A lactation consultant can teach you how to hand express breast milk, if you prefer not to use a pump.  °CARING FOR YOUR BREASTS WHILE YOU BREASTFEED °Nipples can become dry, cracked, and sore while breastfeeding. The following recommendations can help keep your breasts moisturized and healthy: °· Avoid using soap on your nipples.   °· Wear a supportive bra. Although not required, special nursing bras and tank tops are designed to allow access to your breasts for breastfeeding without taking off your entire bra or top. Avoid wearing underwire-style bras or extremely tight bras. °· Air dry your nipples for 3-4 minutes after each feeding.   °· Use only cotton bra pads to absorb leaked breast milk. Leaking of breast milk between feedings is normal.   °· Use lanolin on your nipples after breastfeeding. Lanolin helps to maintain your skin's normal moisture barrier. If you use pure lanolin, you do not need to wash it off before feeding your baby again. Pure lanolin is not toxic to your baby. You may also hand express a few drops of breast milk and gently massage that milk into your nipples and allow the milk to air dry. °In the first few weeks after giving birth, some women experience extremely full breasts (engorgement). Engorgement can make your breasts feel heavy, warm, and tender to the touch. Engorgement peaks within 3-5 days after you give birth. The following recommendations can help ease engorgement: °· Completely empty your breasts while breastfeeding or pumping. You may want to start by applying warm, moist heat (in the shower or with warm water-soaked hand towels) just before feeding or pumping. This increases circulation and helps the milk flow. If your baby does not completely empty your breasts while breastfeeding, pump any extra milk after he or she is finished. °· Wear a snug bra (nursing or regular) or tank top for 1-2 days to signal your body to slightly decrease milk  production. °· Apply ice packs to your breasts, unless this is too uncomfortable for you. °· Make sure that your baby is latched on and positioned properly while breastfeeding. °If engorgement persists after 48 hours of following these recommendations, contact your health care provider or a lactation consultant. °OVERALL HEALTH CARE RECOMMENDATIONS WHILE BREASTFEEDING °· Eat healthy foods. Alternate between meals and snacks, eating 3 of each per day. Because what you eat affects your breast milk, some of the foods may make your baby more irritable than usual. Avoid eating these foods if you are sure that they are negatively affecting your baby. °· Drink milk, fruit juice, and water to satisfy your thirst (about 10 glasses a day).   °· Rest often, relax, and continue to take your prenatal vitamins to prevent fatigue, stress, and anemia. °· Continue breast self-awareness checks. °· Avoid chewing and smoking tobacco. °· Avoid alcohol and drug use. °Some medicines that may be harmful to your baby can pass through breast milk. It is important to ask your health care provider before taking any medicine, including all over-the-counter and prescription medicine as well as vitamin and herbal supplements. °It is possible to become pregnant while breastfeeding. If birth control is desired, ask your health care provider about options that will be safe for your baby. °SEEK MEDICAL CARE IF:  °· You feel like you want to stop breastfeeding or have become frustrated with breastfeeding. °· You have painful breasts or nipples. °· Your nipples are cracked or bleeding. °· Your breasts are red, tender, or warm. °· You have   a swollen area on either breast. °· You have a fever or chills. °· You have nausea or vomiting. °· You have drainage other than breast milk from your nipples. °· Your breasts do not become full before feedings by the fifth day after you give birth. °· You feel sad and depressed. °· Your baby is too sleepy to eat  well. °· Your baby is having trouble sleeping.   °· Your baby is wetting less than 3 diapers in a 24-hour period. °· Your baby has less than 3 stools in a 24-hour period. °· Your baby's skin or the white part of his or her eyes becomes yellow.   °· Your baby is not gaining weight by 5 days of age. °SEEK IMMEDIATE MEDICAL CARE IF:  °· Your baby is overly tired (lethargic) and does not want to wake up and feed. °· Your baby develops an unexplained fever. °Document Released: 10/04/2005 Document Revised: 10/09/2013 Document Reviewed: 03/28/2013 °[employer]® Patient Information ©2015 [employer]. This information is not intended to replace advice given to you by your health care provider. Make sure you discuss any questions you have with your health care provider. ° °

## 2014-08-01 NOTE — Progress Notes (Signed)
Clinical Social Work Department BRIEF PSYCHOSOCIAL ASSESSMENT 08/01/2014  Patient:  Crystal [name] WELLS     Account Number:  [id number]     Admit date:  07/29/2014  Clinical Social Worker:  Janice Seales, CLINICAL SOCIAL WORKER  Date/Time:  08/01/2014 08:30 AM  Referred by:  RN  Date Referred:  07/30/2014 Referred for  Behavioral Health Issues   Other Referral:   Interview type:  Family Other interview type:    PSYCHOSOCIAL DATA Living Status:  FAMILY Admitted from facility:   Level of care:   Primary support name:  Mr. Claxton Primary support relationship to patient:  SPOUSE Degree of support available:   [name] lives with the FOB.  [name] stated that the paternal grandparents live within a 5 minute drive of their home. She shared belief that they are well supported.    CURRENT CONCERNS Current Concerns  None Noted   Other Concerns:   [name] presents with a mental health history signficiant for depression and anxiety.  [name] has been stable and without medications since 2013.    SOCIAL WORK ASSESSMENT / PLAN CSW met with the [name] and the FOB in their room in order to complete the assessment. Consult was ordered due to [name] presenting with a history of depression and anxiety.  [name] provided consent for the assessment to be completed in the FOB's presence, and the FOB was observed to be attending and bonding with the baby throughout the visit.  [name] presented in a pleasant mood and displayed a full range in affect.  [name] and FOB were easily engaged and were receptive to discussing reason for CSW consult.   CSW assisted [name] and FOB process their thoughts and feelings as they transition into the postpartum period.  [name] and FOB smiled as they expressed excitement and looking forward to their transition home.  [name] shared belief that they are well supported, and denied any concerns about asking for help if needed . They confirmed that they have everything they need and that have decorated the nursery  in preparation for the baby's arrival.  [name] and FOB expressed feeling supported by their employers, and [name] denied any difficulties disengaging from her role identity as she is looking forward to staying at home until after the holidays. [name] and FOB denied presence of any psychosocial stressors that will negatively impact their transition into the postpartum period.   [name] expressed normative anxiety associated with becoming a first time parent.  She denied any concerns with her current anxiety level, and she did not verbalize any acute anxiety.  [name] is aware that it is normal for anxiety to increase as she transitions into her new role as a mother.   [name] acknowledged history of depression and anxiety, but shared that it was situational.  She denied any symptoms during her pregnancy and confirmed not needing medications since 2012.  [name] was receptive to education on postpartum depression, and denied any concerns about the information.   No barriers to discharge.   Assessment/plan status:  No Further Intervention Required/No barriers to discharge.  Other assessment/ plan:   CSW provided education on postpartum depression.   Information/referral to community resources:   No referrals needed at this time.    PATIENT'S/FAMILY'S RESPONSE TO PLAN OF CARE: [name] and FOB thanked CSW for the visit and expressed appreciation for the entire treatment team.        

## 2014-08-05 ENCOUNTER — Ambulatory Visit (HOSPITAL_COMMUNITY)
Admission: RE | Admit: 2014-08-05 | Discharge: 2014-08-05 | Disposition: A | Discharge status: Home / Self Care | Payer: 59 | Source: Ambulatory Visit | Attending: Certified Nurse Midwife | Admitting: Certified Nurse Midwife

## 2014-08-05 NOTE — Lactation Note (Signed)
Lactation Consult  Mother's reason for visit: having troubles latching bay to the breast, painful feeding  Appointment Notes: sore nipples Consult:  Initial Lactation Consultant:  Crystal [name], Crystal [name] M  ________________________________________________________________________  Crystal FloresBaby's Name: Crystal RiegerAubrey [name]  Date of Birth: 07/30/2012  [facility]  Gender: female  Gestational Age: 9572w0d (At Birth)  Birth Weight: 8 lb 15 oz (4055 g)  Weight at Discharge: Weight: 8 lb 7.6 oz (3845 g) Date of Discharge: 08/01/2014  [facility]   07/30/14 1626 07/30/14 2339 07/31/14 2344  Weight: 8 lb 15 oz (4055 g) 8 lb 14.7 oz (4045 g) 8 lb 7.6 oz (3845 g)  Last weight taken Location:Pediatrician's office  on 08/03/2014 was  8 lbs and 5 oz Weight today: 8 lbs 9.4 oz    ________________________________________________________________________  Mother's Name: Crystal [name] Type of delivery:  vag Breastfeeding Experience: none, first baby Maternal Medical Conditions:  Gestational diabetes mellitis Maternal Medications:  None, was taking Metformin po in pregnancy  ________________________________________________________________________  Breastfeeding History (Post Discharge)  Frequency of breastfeeding: breast feeds approx. 2 times per day in the few days due to sore nipples Duration of feeding: 5-10 min  Supplementation  Formula:  Volume  60-120 ml   Frequency: every 3 -4 hours        Brand: Enfamil  Breastmilk:  Volume 30-45 ml Frequency:  3 hours    Method:  Bottle  Infant Intake and Output Assessment  Voids:  9 in 24 hrs.  Color:  Clear yellow Stools:  9 in 24 hrs.  Color:  Yellow  ________________________________________________________________________  Maternal Breast Assessment  Breast:  Soft Nipple:  Scabs Pain level:  3 Pain interventions:  Expressed breast milk and coconut oil.    _______________________________________________________________________ Feeding Assessment/Evaluation  Initial feeding assessment:  Infant's oral assessment:  WNL  Positioning:  Cross cradle Right breast and left breast feeding combined  LATCH documentation:  Latch:  2 = Grasps breast easily, tongue down, lips flanged, rhythmical sucking.  Audible swallowing:  2 = Spontaneous and intermittent  Type of nipple:  2 = Everted at rest and after stimulation  Comfort (Breast/Nipple):  1 = Filling, red/small blisters or bruises, mild/mod discomfort  Hold (Positioning):  2 = No assistance needed to correctly position infant at breast  LATCH score:  9  Attached assessment:  Deep with Lactation assistance  Lips flanged:  Yes.    Lips untucked:  Yes.    Suck assessment:  Nutritive initially then sleepy and flutter sucking. Alternate breast massage to transfer milk during feeding taught to mother. Baby would suckle and swallow.  Tools:  Comfort gels to promote healing of scabs. Avoid if yeast like symptoms increase. Instructed on use and cleaning of tool:  Yes.    Pre-feed weight:  3896 g   Post-feed weight:  3930 g  Amount transferred:  34 ml Amount supplemented:  None, baby had just had 4 oz of breast milk and formula 2 hours prior to appointment  Mother has soft breast with noted full ducts. She is lactating and can hand express milk. She reports moderate breast changes during pregnancy. Baby had a weight decrease, was cluster feeding, not sleeping, and acting hungry so parents decided to supplement breastfeeding with formula over the last 24-48 hours. Mother has latched baby several times since discharge from home but nipple trauma increased and became more painful resulting in her pumping her milk and giving by bottle. Today her nipples are less painful. Assisted mother with latching  deeply to the breast. Danne HarborAubrey latches well and fed calmly. Baby transferred 14 ml from the right breast and  20 ml from the left. She had fed 2 hours prior to appointment with 4 oz of breast milk and formula per bottle.  Mother has burning sensation that radiates in the breast and nipples during and after the feeding some of the time. She has a history of vaginal yeast infections and received antibiotics in the hospital. Her nipples and skin are "itchy" per mom. Patient called her care provider and has a prescription for "yeast to take for 14 days' but has not picked up the prescription yet. Nipples are scabbed and areola is pink. Discussed cleaning equipment, bottle nipples, pacifier, clothing in hot water to destroy yeast. Currently she reports her symptoms are mild and she wants to wait to start medication. Baby does not have symptoms of thrush in mouth or on her bottom.  Mother is having a reduced milk volume and she was instructed on ways to increase milk supply. Instructed to breast feed with cues using  alternate breast massage,  pump following feedings and drink plenty of fluids. Discussed power pumping. Reduce supplement volume to promote more feedings. Supplement with breast milk and formula as needed if volume is not increasing. Encouraged mother to attend breastfeeding support groups and [facility].  Baby is gaining weight, latches well, getting supplemented with formula.  Pediatrician appointment on 08-07-2014 with Dr. Dario GuardianPudlo.   Total amount transferred:  34 ml

## 2014-08-19 ENCOUNTER — Encounter (HOSPITAL_COMMUNITY): Payer: Self-pay

## 2015-01-08 LAB — HM PAP SMEAR

## 2015-09-30 ENCOUNTER — Encounter: Payer: Self-pay | Admitting: Certified Nurse Midwife

## 2015-09-30 ENCOUNTER — Ambulatory Visit (INDEPENDENT_AMBULATORY_CARE_PROVIDER_SITE_OTHER): Payer: Managed Care, Other (non HMO) | Admitting: Certified Nurse Midwife

## 2015-09-30 VITALS — BP 108/68 | HR 82 | Resp 18 | Ht 67.5 in | Wt 244.0 lb

## 2015-09-30 DIAGNOSIS — Z8619 Personal history of other infectious and parasitic diseases: Secondary | ICD-10-CM | POA: Diagnosis not present

## 2015-09-30 NOTE — Progress Notes (Signed)
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 Contraception OCP working well. Now on Plaquenil for ? Lupus or unknown autoimmune disorder. Plaquenil is helping with joint of pain. Patient had NSVD one year ago. Baby girl doing well. Patient here " to discuss if everything looks normal in vaginal area". Had good experience with Marlinda Mikeanya Bailey, CNM, but has been here as patient for 6 years and feels better here with these concerns. Occasional stress incontinence, but working on [employer] and seems to be helping. Had uneventful pregnancy except for hypertension. Denies vaginal discharge concerns just wants confirmation if genital wart present and treatment if needed. No other health concerns has aex scheduled here in 2/17.   Exam: Abdomen:soft Inguinal Lymph node: no enlargement or tenderness Pelvic exam: External genital: normal female with very tiny small skin tag, no characteristics of condyloma with magnified look. No other skin lesions noted.  BUS: negative Vagina: normal appearance, no prolapse noted. Normal discharge noted. Good support. Cervix: normal, non tender, no CMT Uterus: normal, non tender Adnexa:normal, non tender, no masses or fullness noted   A:Normal pelvic exam History of condyloma with removal during recent pregnancy, none noted on exam today. ? Auto immune joint disease under follow up Contraception OCP   P:Discussed findings of no condyloma and normal pelvic exam and vaginal tone and support.  Stressed importance of kegels and not holding urine for long periods of time.  Patient to come in if she feels she feels or sees a skin change. Questions addressed regarding condyloma Will recheck at aex. Will review records and advise if any concerns to patient.. Has Rx until aex.   Rv prn  ~15 minutes spent with patient >50% of time was in face to face discussion of above.

## 2015-10-01 NOTE — Progress Notes (Signed)
Reviewed personally.  M. Suzanne Dolora Ridgely, MD.  

## 2015-10-03 ENCOUNTER — Telehealth: Payer: Self-pay | Admitting: Certified Nurse Midwife

## 2015-10-03 NOTE — Telephone Encounter (Signed)
Left message that patient records have been reviews and ready for her to pick up if she wishes. Will hold for 30 days.

## 2015-10-16 ENCOUNTER — Ambulatory Visit: Payer: Self-pay | Admitting: Certified Nurse Midwife

## 2016-01-22 ENCOUNTER — Ambulatory Visit: Payer: Self-pay | Admitting: Certified Nurse Midwife

## 2016-01-29 ENCOUNTER — Ambulatory Visit (INDEPENDENT_AMBULATORY_CARE_PROVIDER_SITE_OTHER): Payer: Managed Care, Other (non HMO) | Admitting: Certified Nurse Midwife

## 2016-01-29 ENCOUNTER — Encounter: Payer: Self-pay | Admitting: Certified Nurse Midwife

## 2016-01-29 VITALS — BP 110/70 | HR 70 | Resp 16 | Ht 67.5 in | Wt 260.0 lb

## 2016-01-29 DIAGNOSIS — R635 Abnormal weight gain: Secondary | ICD-10-CM

## 2016-01-29 DIAGNOSIS — Z01419 Encounter for gynecological examination (general) (routine) without abnormal findings: Secondary | ICD-10-CM

## 2016-01-29 DIAGNOSIS — N912 Amenorrhea, unspecified: Secondary | ICD-10-CM | POA: Diagnosis not present

## 2016-01-29 DIAGNOSIS — Z0000 Encounter for general adult medical examination without abnormal findings: Secondary | ICD-10-CM

## 2016-01-29 LAB — THYROID PANEL WITH TSH
FREE THYROXINE INDEX: 2.6 (ref 1.4–3.8)
T3 UPTAKE: 25 % (ref 22–35)
T4 TOTAL: 10.2 ug/dL (ref 4.5–12.0)
TSH: 0.88 m[IU]/L

## 2016-01-29 LAB — POCT URINE PREGNANCY: Preg Test, Ur: NEGATIVE

## 2016-01-29 MED ORDER — LEVORA 0.15/30 (28) 0.15-30 MG-MCG PO TABS: ORAL_TABLET | ORAL | Status: DC

## 2016-01-29 NOTE — Patient Instructions (Signed)

## 2016-01-29 NOTE — Progress Notes (Signed)
30 y.o. 901P1001 Married  Caucasian Fe here for annual exam. Periods normal, no issues. Contraception working well. Seeing Rheumatology for undetermined ?Lupus every 3 months. Sees PCP yearly, with labs. Was to do thyroid level follow up with PCP, requests today. Feels skin tag has not changed and has not noted any new areas. No other health issues today. Busy with 5718 month old.  Patient's last menstrual period was 12/17/2015.          Sexually active: Yes.    The current method of family planning is OCP (estrogen/progesterone).    Exercising: No.  exercise Smoker:  no  Health Maintenance: Pap:  01-08-15 neg MMG:  none Colonoscopy:  none BMD:   none TDaP:  2009 Shingles: no Pneumonia: no Hep C and HIV: done in the past neg Labs: upt neg Self breast exam: done occ   reports that she has quit smoking. She has never used smokeless tobacco. She reports that she drinks about 3.0 oz of alcohol per week. She reports that she does not use illicit drugs.  Past Medical History  Diagnosis Date  . Depression   . Anxiety   . Hx of varicella   . Headache   . Anemia   . Asthma     inhaler used 2-3/week  . Gestational diabetes mellitus, antepartum   . Gestational diabetes     metformin    Past Surgical History  Procedure Laterality Date  . Cholecystectomy    . Tonsillectomy    . Iud removal      mirena inserted 1/08, removal 11/12  . Wisdom tooth extraction  2007  . Cholecystectomy      Current Outpatient Prescriptions  Medication Sig Dispense Refill  . acetaminophen (TYLENOL) 325 MG tablet Take 650 mg by mouth every 6 (six) hours as needed for mild pain or headache.    . albuterol (PROVENTIL HFA;VENTOLIN HFA) 108 (90 BASE) MCG/ACT inhaler Inhale 2 puffs into the lungs every 6 (six) hours as needed for wheezing or shortness of breath.    . hydroxychloroquine (PLAQUENIL) 200 MG tablet TK 1 T PO BID WITH FOOD OR MILK  1  . ibuprofen (ADVIL,MOTRIN) 600 MG tablet Take 1 tablet (600 mg  total) by mouth every 6 (six) hours. 30 tablet 0  . LEVORA 0.15/30, 28, 0.15-30 MG-MCG tablet TK 1 T PO D  11  . Prenatal Vit-Fe Fumarate-FA (PRENATAL MULTIVITAMIN) TABS tablet Take 1 tablet by mouth daily.     No current facility-administered medications for this visit.    Family History  Problem Relation Age of Onset  . Hyperlipidemia Other   . Hypertension Other   . Diabetes Other   . COPD Other   . Heart disease Other   . Hypertension Father   . Thyroid disease Maternal Grandmother   . Hypertension Maternal Grandfather   . Heart disease Maternal Grandfather   . Cancer Paternal Grandfather     lung    ROS:  Pertinent items are noted in HPI.  Otherwise, a comprehensive ROS was negative.  Exam:   BP 110/70 mmHg  Pulse 70  Resp 16  Ht 5' 7.5" (1.715 m)  Wt 260 lb (117.935 kg)  BMI 40.10 kg/m2  LMP 12/17/2015 Height: 5' 7.5" (171.5 cm) Ht Readings from Last 3 Encounters:  01/29/16 5' 7.5" (1.715 m)  09/30/15 5' 7.5" (1.715 m)  07/29/14 5\' 8"  (1.727 m)    General appearance: alert, cooperative and appears stated age Head: Normocephalic, without obvious abnormality, atraumatic  Neck: no adenopathy, supple, symmetrical, trachea midline and thyroid normal to inspection and palpation Lungs: clear to auscultation bilaterally Breasts: normal appearance, no masses or tenderness, No nipple retraction or dimpling, No nipple discharge or bleeding, No axillary or supraclavicular adenopathy Heart: regular rate and rhythm Abdomen: soft, non-tender; no masses,  no organomegaly Extremities: extremities normal, atraumatic, no cyanosis or edema Skin: Skin color, texture, turgor normal. No rashes or lesions Lymph nodes: Cervical, supraclavicular, and axillary nodes normal. No abnormal inguinal nodes palpated Neurologic: Grossly normal   Pelvic: External genitalia:  Normal female, small skin tag noted on left vulva, no condyloma characteristics              Urethra:  normal appearing  urethra with no masses, tenderness or lesions              Bartholin's and Skene's: normal                 Vagina: normal appearing vagina with normal color and discharge, no lesions              Cervix: no cervical motion tenderness, no lesions and normal              Pap taken: No. Bimanual Exam:  Uterus:  normal size, contour, position, consistency, mobility, non-tender              Adnexa: normal adnexa and no mass, fullness, tenderness               Rectovaginal: Confirms               Anus:  normal appearance  Chaperone present: yes  A:  Well Woman with normal exam  Contraception OCP desired  ?enlarged thyroid, needs lab follow up  Weight gain 14 pounds in past year  ? Lupus on Plaquenil with Rheumatology management  Skin tag in vulva area  unchanged  P:   Reviewed health and wellness pertinent to exam  Rx Levora see order with instructions  Lab TSH with panel  Discussed weight gain may also be affected her joint issues. Discussed portion control, eating fresh , decreasing boxed or prepared foods. Discussed good food choices. Questions answered. Encouraged regular exercise and try to drink only water, which will help with weight loss and maintaining good urinary and bowel health.  Continue follow up with Rheumatology as indicated.  Advise if she feels skin tag change to come for evaluation.   Pap smear as above not taken   counseled on breast self exam, use and side effects of OCP's, adequate intake of calcium and vitamin D, diet and exercise return annually or prn  An After Visit Summary was printed and given to the patient.

## 2016-02-03 NOTE — Progress Notes (Signed)
Encounter reviewed Yarnell Kozloski, MD   

## 2016-02-03 NOTE — Addendum Note (Signed)
Addended by: Etta GrandchildJONES, Mahoganie Basher L on: 02/03/2016 12:45 PM   Modules accepted: Kipp BroodSmartSet

## 2017-02-01 ENCOUNTER — Ambulatory Visit: Payer: Managed Care, Other (non HMO) | Admitting: Certified Nurse Midwife

## 2017-08-31 LAB — OB RESULTS CONSOLE GC/CHLAMYDIA
CHLAMYDIA, DNA PROBE: NEGATIVE
GC PROBE AMP, GENITAL: NEGATIVE

## 2017-08-31 LAB — OB RESULTS CONSOLE ANTIBODY SCREEN: ANTIBODY SCREEN: NEGATIVE

## 2017-08-31 LAB — OB RESULTS CONSOLE RUBELLA ANTIBODY, IGM: Rubella: IMMUNE

## 2017-08-31 LAB — OB RESULTS CONSOLE HEPATITIS B SURFACE ANTIGEN: HEP B S AG: NEGATIVE

## 2017-08-31 LAB — OB RESULTS CONSOLE RPR: RPR: NONREACTIVE

## 2017-08-31 LAB — OB RESULTS CONSOLE ABO/RH: RH Type: POSITIVE

## 2017-08-31 LAB — OB RESULTS CONSOLE HIV ANTIBODY (ROUTINE TESTING): HIV: NONREACTIVE

## 2017-10-18 NOTE — L&D Delivery Note (Signed)
Delivery Note At 11:48 PM a viable female was delivered via Vaginal, Spontaneous.  Presentation: vertex; Position: Left,, Occiput,, Anterior; Station: +3.  Delivery of the head: 2348 First Maneuver: McRoberts Second maneuver: ,   Third maneuver: ,   Fourth maneuver: ,   Fifth maneuver: ,   Sixth maneuver: ,   Mild dystocia relieved by McRoberts and baby delivered over intact perineum   APGAR: , ; weight  .   Placenta status: Spontaneous ; intact Cord: 3 VV  with the following complications: none  Anesthesia:  Epidural Episiotomy: None Lacerations:  Periurethral hemostatic ; not repaired Suture Repair:  Est. Blood Loss (mL):  100  Mom to postpartum.  Baby to Couplet care / Skin to Skin.  Tonni Mansour A Jeroline Wolbert CNM 04/30/2018, 12:13 AM

## 2017-11-09 ENCOUNTER — Other Ambulatory Visit (HOSPITAL_COMMUNITY): Payer: Self-pay | Admitting: Obstetrics and Gynecology

## 2017-11-09 DIAGNOSIS — Z3689 Encounter for other specified antenatal screening: Secondary | ICD-10-CM

## 2017-11-09 DIAGNOSIS — O09891 Supervision of other high risk pregnancies, first trimester: Secondary | ICD-10-CM

## 2017-11-09 DIAGNOSIS — M792 Neuralgia and neuritis, unspecified: Secondary | ICD-10-CM

## 2017-11-09 DIAGNOSIS — Z3A18 18 weeks gestation of pregnancy: Secondary | ICD-10-CM

## 2017-11-23 ENCOUNTER — Encounter (HOSPITAL_COMMUNITY): Payer: Self-pay | Admitting: Obstetrics and Gynecology

## 2017-11-28 ENCOUNTER — Encounter (HOSPITAL_COMMUNITY): Payer: Self-pay | Admitting: *Deleted

## 2017-12-01 ENCOUNTER — Ambulatory Visit (HOSPITAL_COMMUNITY)
Admission: RE | Admit: 2017-12-01 | Discharge: 2017-12-01 | Disposition: A | Discharge status: Home / Self Care | Payer: Medicaid Other | Source: Ambulatory Visit | Attending: Obstetrics and Gynecology | Admitting: Obstetrics and Gynecology

## 2017-12-01 ENCOUNTER — Other Ambulatory Visit (HOSPITAL_COMMUNITY): Payer: Self-pay | Admitting: Obstetrics and Gynecology

## 2017-12-01 ENCOUNTER — Encounter (HOSPITAL_COMMUNITY): Payer: Self-pay

## 2017-12-01 ENCOUNTER — Other Ambulatory Visit: Payer: Self-pay

## 2017-12-01 ENCOUNTER — Other Ambulatory Visit (HOSPITAL_COMMUNITY): Payer: Self-pay | Admitting: *Deleted

## 2017-12-01 DIAGNOSIS — O9989 Other specified diseases and conditions complicating pregnancy, childbirth and the puerperium: Secondary | ICD-10-CM

## 2017-12-01 DIAGNOSIS — O99212 Obesity complicating pregnancy, second trimester: Secondary | ICD-10-CM | POA: Diagnosis not present

## 2017-12-01 DIAGNOSIS — Z3689 Encounter for other specified antenatal screening: Secondary | ICD-10-CM | POA: Diagnosis not present

## 2017-12-01 DIAGNOSIS — O09299 Supervision of pregnancy with other poor reproductive or obstetric history, unspecified trimester: Secondary | ICD-10-CM

## 2017-12-01 DIAGNOSIS — Z3A18 18 weeks gestation of pregnancy: Secondary | ICD-10-CM

## 2017-12-01 DIAGNOSIS — R894 Abnormal immunological findings in specimens from other organs, systems and tissues: Secondary | ICD-10-CM | POA: Diagnosis not present

## 2017-12-01 DIAGNOSIS — Z79899 Other long term (current) drug therapy: Secondary | ICD-10-CM | POA: Insufficient documentation

## 2017-12-01 DIAGNOSIS — M792 Neuralgia and neuritis, unspecified: Secondary | ICD-10-CM

## 2017-12-01 DIAGNOSIS — R760 Raised antibody titer: Secondary | ICD-10-CM

## 2017-12-01 DIAGNOSIS — M797 Fibromyalgia: Secondary | ICD-10-CM | POA: Diagnosis not present

## 2017-12-01 DIAGNOSIS — O09891 Supervision of other high risk pregnancies, first trimester: Secondary | ICD-10-CM | POA: Diagnosis not present

## 2017-12-01 DIAGNOSIS — Z362 Encounter for other antenatal screening follow-up: Secondary | ICD-10-CM

## 2017-12-01 DIAGNOSIS — E669 Obesity, unspecified: Secondary | ICD-10-CM | POA: Diagnosis not present

## 2017-12-01 NOTE — Consult Note (Signed)
MFM consult  32 yr old G2P1001 at 2046w3d with fibromyalgia and positive anticardiolipin IgM referred by Nigel BridgemanVicki Latham for fetal anatomic survey and consult.  Past Ob hx: 2015- full term SVD; gestational diabetes A2 on metformin  PMH: fibromyalgia  Medications: PNV, prozac, plaquenil (recently ran out)  Ultrasound today shows: single intrauterine pregnancy. Fetal biometry is consistent with dating. Posterior placenta without evidence of previa. Normal amniotic fluid volume. Normal transabdominal cervical length. No adnexal masses visualized. The views of the palate and heart are limited. The remainder of the limited anatomy survey is normal.   I counseled the patient as follows: 1. Appropriate fetal growth. 2. Limited anatomy survey: - recommend follow up in 4 weeks to reevaluate fetal anatomy 3. Fibromyalgia: -patient followed by Rheumatology - is on prozac and was on plaquenil but ran out 1 month ago - reports symptoms are doing well during this pregnancy - takes tylenol prn - discussed there is limited controlled data on plaquenil use in pregnancy; has not been shown to be associated with increased risk of congenital anomalies; it does cross the placenta; has been used in pregnancy to treat women with lupus and other autoimmune conditions and if needed for management of symptoms is considered acceptable to continue treatment in pregnancy 4. Positive anticardiolipin IgM: -  Patient was screened during work up for fatigue and generalized pain. Patient has no history of thromboembolism or adverse pregnancy outcome. Therefore the patient does not meet criteria for antiphospholipid antibody syndrome.   I discussed with the patient that there is limited data on how to manage patients with elevated either anticardiolipin or lupus anticoagulant antibodies without evidence of the syndrome. There may be some evidence that there is an increased risk of venous thromboembolism especially in pregnancy  and the postpartum but this data has not been consistent. There may also be an increased risk of preeclampsia and fetal growth restriction.  - recommend low dose aspirin 81mg /day - recommend serial fetal growth every 4-6 weeks - recommend close surveillance for the development of signs/symptoms of preeclampsia - recommend appropriate DVT prophylaxis during labor and postpartum 5. Low risk cell free fetal DNA and MSAFP 6. History of gestational diabetes: - discussed increased risk of gestational diabetes in this pregnancy as well as lifetime increased risk of type II diabetes - had normal HgbA1C earlier in pregnancy - recommend consider early 1 hour glucola; if normal recommend repeat at 24-28 weeks; if elevated recommend 3 hr gtt 7. Patient's cousin recently had a baby with congenital CMV: - advised to not have contact with the baby until it is deemed that the baby can no longer transmit CMV  I spent a total of 45 minutes with the patient of which >50% was in face to face consultation. Please call with questions.  Eulis FosterKristen Terrel Manalo, MD

## 2017-12-29 ENCOUNTER — Ambulatory Visit (HOSPITAL_COMMUNITY)
Admission: RE | Admit: 2017-12-29 | Discharge: 2017-12-29 | Disposition: A | Discharge status: Home / Self Care | Payer: Medicaid Other | Source: Ambulatory Visit | Attending: Obstetrics and Gynecology | Admitting: Obstetrics and Gynecology

## 2017-12-29 ENCOUNTER — Other Ambulatory Visit (HOSPITAL_COMMUNITY): Payer: Self-pay | Admitting: Obstetrics and Gynecology

## 2017-12-29 ENCOUNTER — Encounter (HOSPITAL_COMMUNITY): Payer: Self-pay

## 2017-12-29 DIAGNOSIS — O9989 Other specified diseases and conditions complicating pregnancy, childbirth and the puerperium: Secondary | ICD-10-CM

## 2017-12-29 DIAGNOSIS — Z362 Encounter for other antenatal screening follow-up: Secondary | ICD-10-CM

## 2017-12-29 DIAGNOSIS — O321XX0 Maternal care for breech presentation, not applicable or unspecified: Secondary | ICD-10-CM | POA: Insufficient documentation

## 2017-12-29 DIAGNOSIS — R760 Raised antibody titer: Secondary | ICD-10-CM

## 2017-12-29 DIAGNOSIS — Z3689 Encounter for other specified antenatal screening: Secondary | ICD-10-CM | POA: Insufficient documentation

## 2017-12-29 DIAGNOSIS — Z3A22 22 weeks gestation of pregnancy: Secondary | ICD-10-CM | POA: Insufficient documentation

## 2017-12-29 DIAGNOSIS — O99891 Other specified diseases and conditions complicating pregnancy: Secondary | ICD-10-CM

## 2018-01-25 ENCOUNTER — Ambulatory Visit: Payer: Federal, State, Local not specified - PPO

## 2018-02-01 ENCOUNTER — Encounter: Payer: Medicaid Other | Attending: Obstetrics and Gynecology | Admitting: Registered"

## 2018-02-01 DIAGNOSIS — Z713 Dietary counseling and surveillance: Secondary | ICD-10-CM | POA: Insufficient documentation

## 2018-02-01 DIAGNOSIS — O99810 Abnormal glucose complicating pregnancy: Secondary | ICD-10-CM | POA: Insufficient documentation

## 2018-02-03 ENCOUNTER — Encounter: Payer: Self-pay | Admitting: Registered"

## 2018-02-03 DIAGNOSIS — O99810 Abnormal glucose complicating pregnancy: Secondary | ICD-10-CM | POA: Insufficient documentation

## 2018-02-03 NOTE — Progress Notes (Signed)
Patient was seen on 02/01/2018 for Gestational Diabetes self-management class at the [facility]. The following learning objectives were met by the patient during this course:   States the definition of Gestational Diabetes  States why dietary management is important in controlling blood glucose  Describes the effects each nutrient has on blood glucose levels  Demonstrates ability to create a balanced meal plan  Demonstrates carbohydrate counting   States when to check blood glucose levels  Demonstrates proper blood glucose monitoring techniques  States the effect of stress and exercise on blood glucose levels  States the importance of limiting caffeine and abstaining from alcohol and smoking  Blood glucose monitor given: Accu-Chek Guide Lot # [id number] Exp: 01/21/2019 Blood glucose reading: 94  Patient instructed to monitor glucose levels: FBS: 60 - <95; 1 hour: <140; 2 hour: <120  Patient received handouts:  Nutrition Diabetes and Pregnancy, including carb counting list  Patient will be seen for follow-up as needed.

## 2018-02-14 LAB — OB RESULTS CONSOLE GBS: GBS: POSITIVE

## 2018-02-15 ENCOUNTER — Other Ambulatory Visit (HOSPITAL_COMMUNITY): Payer: Self-pay | Admitting: Obstetrics and Gynecology

## 2018-02-15 DIAGNOSIS — Z3A31 31 weeks gestation of pregnancy: Secondary | ICD-10-CM

## 2018-02-15 DIAGNOSIS — O99213 Obesity complicating pregnancy, third trimester: Secondary | ICD-10-CM

## 2018-02-15 DIAGNOSIS — M797 Fibromyalgia: Secondary | ICD-10-CM

## 2018-02-27 ENCOUNTER — Encounter (HOSPITAL_COMMUNITY): Payer: Self-pay

## 2018-02-28 ENCOUNTER — Ambulatory Visit (HOSPITAL_COMMUNITY)
Admission: RE | Admit: 2018-02-28 | Discharge: 2018-02-28 | Disposition: A | Discharge status: Home / Self Care | Payer: Medicaid Other | Source: Ambulatory Visit | Attending: Obstetrics and Gynecology | Admitting: Obstetrics and Gynecology

## 2018-02-28 DIAGNOSIS — O99213 Obesity complicating pregnancy, third trimester: Secondary | ICD-10-CM | POA: Insufficient documentation

## 2018-02-28 DIAGNOSIS — Z3A31 31 weeks gestation of pregnancy: Secondary | ICD-10-CM | POA: Diagnosis not present

## 2018-02-28 DIAGNOSIS — M797 Fibromyalgia: Secondary | ICD-10-CM | POA: Insufficient documentation

## 2018-02-28 HISTORY — DX: Gastro-esophageal reflux disease without esophagitis: K21.9

## 2018-03-07 ENCOUNTER — Other Ambulatory Visit (HOSPITAL_COMMUNITY): Payer: Self-pay | Admitting: Obstetrics and Gynecology

## 2018-03-07 DIAGNOSIS — Z3A32 32 weeks gestation of pregnancy: Secondary | ICD-10-CM

## 2018-03-07 DIAGNOSIS — O24415 Gestational diabetes mellitus in pregnancy, controlled by oral hypoglycemic drugs: Secondary | ICD-10-CM

## 2018-03-07 DIAGNOSIS — O403XX0 Polyhydramnios, third trimester, not applicable or unspecified: Secondary | ICD-10-CM

## 2018-03-15 ENCOUNTER — Encounter (HOSPITAL_COMMUNITY): Payer: Self-pay

## 2018-03-16 ENCOUNTER — Encounter (HOSPITAL_COMMUNITY): Payer: Self-pay

## 2018-03-16 ENCOUNTER — Ambulatory Visit (HOSPITAL_COMMUNITY)
Admission: RE | Admit: 2018-03-16 | Discharge: 2018-03-16 | Disposition: A | Discharge status: Home / Self Care | Payer: Medicaid Other | Source: Ambulatory Visit | Attending: Obstetrics and Gynecology | Admitting: Obstetrics and Gynecology

## 2018-03-16 DIAGNOSIS — O403XX0 Polyhydramnios, third trimester, not applicable or unspecified: Secondary | ICD-10-CM

## 2018-03-16 DIAGNOSIS — Z3A32 32 weeks gestation of pregnancy: Secondary | ICD-10-CM

## 2018-03-16 DIAGNOSIS — O24415 Gestational diabetes mellitus in pregnancy, controlled by oral hypoglycemic drugs: Secondary | ICD-10-CM

## 2018-03-16 HISTORY — DX: Fibromyalgia: M79.7

## 2018-04-17 ENCOUNTER — Telehealth (HOSPITAL_COMMUNITY): Payer: Self-pay | Admitting: *Deleted

## 2018-04-17 ENCOUNTER — Encounter (HOSPITAL_COMMUNITY): Payer: Self-pay | Admitting: *Deleted

## 2018-04-17 NOTE — Telephone Encounter (Signed)
Preadmission screen  

## 2018-04-18 ENCOUNTER — Encounter (HOSPITAL_COMMUNITY): Payer: Self-pay | Admitting: *Deleted

## 2018-04-18 ENCOUNTER — Telehealth (HOSPITAL_COMMUNITY): Payer: Self-pay | Admitting: *Deleted

## 2018-04-18 NOTE — Telephone Encounter (Signed)
Preadmission screen  

## 2018-04-27 ENCOUNTER — Other Ambulatory Visit: Payer: Self-pay | Admitting: Obstetrics & Gynecology

## 2018-04-29 ENCOUNTER — Inpatient Hospital Stay (HOSPITAL_COMMUNITY)
Admission: RE | Admit: 2018-04-29 | Discharge: 2018-05-01 | DRG: 806 | Disposition: A | Discharge status: Home / Self Care | Payer: Medicaid Other | Source: Ambulatory Visit | Attending: Obstetrics & Gynecology | Admitting: Obstetrics & Gynecology

## 2018-04-29 ENCOUNTER — Encounter (HOSPITAL_COMMUNITY): Payer: Self-pay

## 2018-04-29 ENCOUNTER — Other Ambulatory Visit: Payer: Self-pay

## 2018-04-29 ENCOUNTER — Inpatient Hospital Stay (HOSPITAL_COMMUNITY): Payer: Medicaid Other | Admitting: Anesthesiology

## 2018-04-29 ENCOUNTER — Inpatient Hospital Stay (HOSPITAL_COMMUNITY)
Admission: AD | Admit: 2018-04-29 | Payer: Medicaid Other | Source: Ambulatory Visit | Admitting: Obstetrics and Gynecology

## 2018-04-29 DIAGNOSIS — Z7951 Long term (current) use of inhaled steroids: Secondary | ICD-10-CM | POA: Diagnosis not present

## 2018-04-29 DIAGNOSIS — D649 Anemia, unspecified: Secondary | ICD-10-CM | POA: Diagnosis not present

## 2018-04-29 DIAGNOSIS — O403XX0 Polyhydramnios, third trimester, not applicable or unspecified: Secondary | ICD-10-CM | POA: Diagnosis present

## 2018-04-29 DIAGNOSIS — O99824 Streptococcus B carrier state complicating childbirth: Secondary | ICD-10-CM | POA: Diagnosis present

## 2018-04-29 DIAGNOSIS — O99214 Obesity complicating childbirth: Secondary | ICD-10-CM | POA: Diagnosis present

## 2018-04-29 DIAGNOSIS — O9962 Diseases of the digestive system complicating childbirth: Secondary | ICD-10-CM | POA: Diagnosis present

## 2018-04-29 DIAGNOSIS — J45909 Unspecified asthma, uncomplicated: Secondary | ICD-10-CM | POA: Diagnosis present

## 2018-04-29 DIAGNOSIS — O3663X0 Maternal care for excessive fetal growth, third trimester, not applicable or unspecified: Secondary | ICD-10-CM | POA: Diagnosis present

## 2018-04-29 DIAGNOSIS — O9902 Anemia complicating childbirth: Secondary | ICD-10-CM | POA: Diagnosis not present

## 2018-04-29 DIAGNOSIS — O24425 Gestational diabetes mellitus in childbirth, controlled by oral hypoglycemic drugs: Principal | ICD-10-CM | POA: Diagnosis present

## 2018-04-29 DIAGNOSIS — Z79899 Other long term (current) drug therapy: Secondary | ICD-10-CM

## 2018-04-29 DIAGNOSIS — F419 Anxiety disorder, unspecified: Secondary | ICD-10-CM | POA: Diagnosis present

## 2018-04-29 DIAGNOSIS — O9952 Diseases of the respiratory system complicating childbirth: Secondary | ICD-10-CM | POA: Diagnosis present

## 2018-04-29 DIAGNOSIS — K219 Gastro-esophageal reflux disease without esophagitis: Secondary | ICD-10-CM | POA: Diagnosis present

## 2018-04-29 DIAGNOSIS — O99344 Other mental disorders complicating childbirth: Secondary | ICD-10-CM | POA: Diagnosis present

## 2018-04-29 DIAGNOSIS — Z7982 Long term (current) use of aspirin: Secondary | ICD-10-CM | POA: Diagnosis not present

## 2018-04-29 DIAGNOSIS — F329 Major depressive disorder, single episode, unspecified: Secondary | ICD-10-CM | POA: Diagnosis present

## 2018-04-29 DIAGNOSIS — A630 Anogenital (venereal) warts: Secondary | ICD-10-CM | POA: Diagnosis present

## 2018-04-29 DIAGNOSIS — O24429 Gestational diabetes mellitus in childbirth, unspecified control: Secondary | ICD-10-CM | POA: Diagnosis present

## 2018-04-29 DIAGNOSIS — Z3A40 40 weeks gestation of pregnancy: Secondary | ICD-10-CM

## 2018-04-29 DIAGNOSIS — Z87891 Personal history of nicotine dependence: Secondary | ICD-10-CM

## 2018-04-29 DIAGNOSIS — O24419 Gestational diabetes mellitus in pregnancy, unspecified control: Secondary | ICD-10-CM | POA: Diagnosis present

## 2018-04-29 DIAGNOSIS — O9832 Other infections with a predominantly sexual mode of transmission complicating childbirth: Secondary | ICD-10-CM | POA: Diagnosis present

## 2018-04-29 DIAGNOSIS — E669 Obesity, unspecified: Secondary | ICD-10-CM | POA: Diagnosis present

## 2018-04-29 LAB — GLUCOSE, CAPILLARY
GLUCOSE-CAPILLARY: 98 mg/dL (ref 70–99)
Glucose-Capillary: 99 mg/dL (ref 70–99)
Glucose-Capillary: 109 mg/dL — ABNORMAL HIGH (ref 70–99)
Glucose-Capillary: 92 mg/dL (ref 70–99)
Glucose-Capillary: 93 mg/dL (ref 70–99)
Glucose-Capillary: 75 mg/dL (ref 70–99)

## 2018-04-29 LAB — CBC
HCT: 29.9 % — ABNORMAL LOW (ref 36.0–46.0)
Hemoglobin: 9.8 g/dL — ABNORMAL LOW (ref 12.0–15.0)
MCH: 27.3 pg (ref 26.0–34.0)
MCHC: 32.8 g/dL (ref 30.0–36.0)
MCV: 83.3 fL (ref 78.0–100.0)
PLATELETS: 220 10*3/uL (ref 150–400)
RBC: 3.59 MIL/uL — ABNORMAL LOW (ref 3.87–5.11)
RDW: 14.4 % (ref 11.5–15.5)
WBC: 7.9 10*3/uL (ref 4.0–10.5)

## 2018-04-29 LAB — TYPE AND SCREEN
ABO/RH(D): O POS
Antibody Screen: NEGATIVE

## 2018-04-29 LAB — RPR: RPR: NONREACTIVE

## 2018-04-29 MED ORDER — PHENYLEPHRINE 40 MCG/ML (10ML) SYRINGE FOR IV PUSH (FOR BLOOD PRESSURE SUPPORT)
80.0000 ug | PREFILLED_SYRINGE | INTRAVENOUS | Status: DC | PRN
  Administered 2018-04-29: 80 ug via INTRAVENOUS
  Filled 2018-04-29: qty 5

## 2018-04-29 MED ORDER — OXYTOCIN 40 UNITS IN LACTATED RINGERS INFUSION - SIMPLE MED
1.0000 m[IU]/min | INTRAVENOUS | Status: DC
  Filled 2018-04-29 (×2): qty 1000

## 2018-04-29 MED ORDER — LACTATED RINGERS IV SOLN: 500.0000 mL | INTRAVENOUS | Status: DC | PRN

## 2018-04-29 MED ORDER — ONDANSETRON HCL 4 MG/2ML IJ SOLN: 4.0000 mg | Freq: Four times a day (QID) | INTRAMUSCULAR | Status: DC | PRN

## 2018-04-29 MED ORDER — ACETAMINOPHEN 325 MG PO TABS: 650.0000 mg | ORAL_TABLET | ORAL | Status: DC | PRN

## 2018-04-29 MED ORDER — OXYTOCIN 40 UNITS IN LACTATED RINGERS INFUSION - SIMPLE MED: 2.5000 [IU]/h | INTRAVENOUS | Status: DC

## 2018-04-29 MED ORDER — LACTATED RINGERS IV SOLN
INTRAVENOUS | Status: DC
  Administered 2018-04-29 (×4): via INTRAVENOUS

## 2018-04-29 MED ORDER — FLEET ENEMA 7-19 GM/118ML RE ENEM: 1.0000 | ENEMA | RECTAL | Status: DC | PRN

## 2018-04-29 MED ORDER — PHENYLEPHRINE 40 MCG/ML (10ML) SYRINGE FOR IV PUSH (FOR BLOOD PRESSURE SUPPORT)
80.0000 ug | PREFILLED_SYRINGE | INTRAVENOUS | Status: DC | PRN
  Filled 2018-04-29: qty 10
  Filled 2018-04-29: qty 5
  Filled 2018-04-29: qty 10

## 2018-04-29 MED ORDER — OXYTOCIN BOLUS FROM INFUSION
500.0000 mL | Freq: Once | INTRAVENOUS | Status: AC
  Administered 2018-04-29: 500 mL via INTRAVENOUS

## 2018-04-29 MED ORDER — LIDOCAINE HCL (PF) 1 % IJ SOLN
30.0000 mL | INTRAMUSCULAR | Status: DC | PRN
  Filled 2018-04-29: qty 30

## 2018-04-29 MED ORDER — EPHEDRINE 5 MG/ML INJ: 10.0000 mg | INTRAVENOUS | Status: DC | PRN

## 2018-04-29 MED ORDER — EPHEDRINE 5 MG/ML INJ
10.0000 mg | INTRAVENOUS | Status: DC | PRN
  Filled 2018-04-29: qty 2

## 2018-04-29 MED ORDER — MISOPROSTOL 25 MCG QUARTER TABLET
25.0000 ug | ORAL_TABLET | ORAL | Status: DC | PRN
  Administered 2018-04-29 (×3): 25 ug via VAGINAL
  Filled 2018-04-29 (×4): qty 1

## 2018-04-29 MED ORDER — SOD CITRATE-CITRIC ACID 500-334 MG/5ML PO SOLN: 30.0000 mL | ORAL | Status: DC | PRN

## 2018-04-29 MED ORDER — FENTANYL 2.5 MCG/ML BUPIVACAINE 1/10 % EPIDURAL INFUSION (WH - ANES)
14.0000 mL/h | INTRAMUSCULAR | Status: DC | PRN
  Administered 2018-04-29 (×2): 14 mL/h via EPIDURAL
  Filled 2018-04-29 (×2): qty 100

## 2018-04-29 MED ORDER — PHENYLEPHRINE 40 MCG/ML (10ML) SYRINGE FOR IV PUSH (FOR BLOOD PRESSURE SUPPORT): 80.0000 ug | PREFILLED_SYRINGE | INTRAVENOUS | Status: DC | PRN

## 2018-04-29 MED ORDER — DIPHENHYDRAMINE HCL 50 MG/ML IJ SOLN: 12.5000 mg | INTRAMUSCULAR | Status: DC | PRN

## 2018-04-29 MED ORDER — LIDOCAINE HCL (PF) 1 % IJ SOLN
INTRAMUSCULAR | Status: DC | PRN
  Administered 2018-04-29: 5 mL via EPIDURAL

## 2018-04-29 MED ORDER — FENTANYL CITRATE (PF) 100 MCG/2ML IJ SOLN
50.0000 ug | INTRAMUSCULAR | Status: DC | PRN
  Administered 2018-04-29 (×2): 100 ug via INTRAVENOUS
  Filled 2018-04-29 (×2): qty 2

## 2018-04-29 MED ORDER — OXYTOCIN 40 UNITS IN LACTATED RINGERS INFUSION - SIMPLE MED
1.0000 m[IU]/min | INTRAVENOUS | Status: DC
  Administered 2018-04-29: 2 m[IU]/min via INTRAVENOUS

## 2018-04-29 MED ORDER — TERBUTALINE SULFATE 1 MG/ML IJ SOLN
0.2500 mg | Freq: Once | INTRAMUSCULAR | Status: DC | PRN
  Filled 2018-04-29: qty 1

## 2018-04-29 MED ORDER — PENICILLIN G POT IN DEXTROSE 60000 UNIT/ML IV SOLN
3.0000 10*6.[IU] | INTRAVENOUS | Status: DC
  Administered 2018-04-29 (×3): 3 10*6.[IU] via INTRAVENOUS
  Filled 2018-04-29 (×10): qty 50

## 2018-04-29 MED ORDER — LACTATED RINGERS IV SOLN
500.0000 mL | Freq: Once | INTRAVENOUS | Status: AC
  Administered 2018-04-29: 500 mL via INTRAVENOUS

## 2018-04-29 MED ORDER — SODIUM CHLORIDE 0.9 % IV SOLN
5.0000 10*6.[IU] | Freq: Once | INTRAVENOUS | Status: AC
  Administered 2018-04-29: 5 10*6.[IU] via INTRAVENOUS
  Filled 2018-04-29: qty 5

## 2018-04-29 MED ORDER — LACTATED RINGERS IV SOLN: 500.0000 mL | Freq: Once | INTRAVENOUS | Status: DC

## 2018-04-29 NOTE — Progress Notes (Signed)
Subjective: Just received first dose of fentanyl; uncomfortable with contractions. [facility]  Objective: BP (!) 104/57   Pulse 82   Temp 98.7 F (37.1 C) (Axillary)   Resp 18   Ht 5\' 8"  (1.727 m)   Wt 281 lb (127.5 kg)   LMP 07/25/2017 (LMP Unknown)   BMI 42.73 kg/m  No intake/output data recorded. Total I/O In: 693.8 [I.V.:693.8] Out: -   FHT: Category 1 UC:   regular, every 3-5 minutes SVE:   Dilation: 2.5 Effacement (%): 50 Station: -2 Exam by:: Shavawn Stobaugh CNM Cytotec dose # 3 inserted   Assessment:  GDMA2 Polyhydramnios LGA GBS POsitive Plan: Epidural when desired Arom when head comes down Start pitocin if needed approximately 2pm contniue ABX for GBS positive Anticipate Vaginal delivery  Keyshon Stein A Celine Dishman CNM, [place] 04/29/2018, 10:54 AM

## 2018-04-29 NOTE — Anesthesia Pain Management Evaluation Note (Signed)
  CRNA Pain Management Visit Note  Patient: Crystal [name] Sees, 32 y.o., female  "Hello [facility] am [facility]. We have an anesthesia team available at all times to provide care throughout the hospital, including epidural management and anesthesia for C-section. I don't know your plan for the delivery whether it a natural birth, water birth, IV sedation, nitrous supplementation, doula or epidural, but we want to meet your pain goals."   1.Was your pain managed to your expectations on prior hospitalizations?   Yes   2.What is your expectation for pain management during this hospitalization?     Epidural  3.How can we help you reach that goal? Support prn  Record the patient's initial score and the patient's pain goal.   Pain: 1  Pain Goal: 5 [facility] wants you to be able to say your pain was always managed very well.  [facility] CenterWRINKLE,Serra Younan 04/29/2018

## 2018-04-29 NOTE — Anesthesia Preprocedure Evaluation (Addendum)
Anesthesia Evaluation  Patient identified by MRN, date of birth, ID band Patient awake    Reviewed: Allergy & Precautions, NPO status , Patient's Chart, lab work & pertinent test results  Airway Mallampati: III  TM Distance: >3 FB Neck ROM: Full    Dental no notable dental hx. (+) Teeth Intact   Pulmonary asthma , former smoker,    Pulmonary exam normal breath sounds clear to auscultation       Cardiovascular Exercise Tolerance: Good negative cardio ROS Normal cardiovascular exam Rhythm:Regular Rate:Normal     Neuro/Psych  Headaches, Anxiety    GI/Hepatic   Endo/Other  diabetes, GestationalMorbid obesity  Renal/GU      Musculoskeletal  (+) Fibromyalgia -  Abdominal (+) + obese,   Peds  Hematology  (+) anemia ,   Anesthesia Other Findings Pt complains of bilateral posterior leg numbness prior to epidural. She sees a rheumaologist for nonspecific sx since last pregnacy.  Reproductive/Obstetrics (+) Pregnancy                            Lab Results  Component Value Date   WBC 7.9 04/29/2018   HGB 9.8 (L) 04/29/2018   HCT 29.9 (L) 04/29/2018   MCV 83.3 04/29/2018   PLT 220 04/29/2018    Anesthesia Physical Anesthesia Plan  ASA: III  Anesthesia Plan: Epidural   Post-op Pain Management:    Induction:   PONV Risk Score and Plan:   Airway Management Planned:   Additional Equipment:   Intra-op Plan:   Post-operative Plan:   Informed Consent:   Plan Discussed with:   Anesthesia Plan Comments:         Anesthesia Quick Evaluation

## 2018-04-29 NOTE — Progress Notes (Signed)
Labor Progress Note  Subjective: Crystal [name], 32 y.o., G2P1001, with an IUP @ 6430w5d, presented for IOL for GDMA2 on glyburide, obesity and polyhydramnios.Pt resting in bed with husband at bedside, pt endorses starting to feel contractions, but not to much pain and able to breath through them.  Patient Active Problem List   Diagnosis Date Noted  . Gestational diabetes mellitus (GDM) affecting second pregnancy 04/29/2018  . Abnormal glucose tolerance test (GTT) during pregnancy, antepartum 02/03/2018  . SVD (spontaneous vaginal delivery) 07/30/2014  . Postpartum care following vaginal delivery (10/13) 07/30/2014  . GDM, class A2 07/29/2014  . Numbness and tingling 08/19/2011  . Anxiety, generalized 08/19/2011  . DEPRESSION 08/06/2009  . ASTHMA 08/06/2009   Objective: BP 122/60   Pulse 79   Temp 98.2 F (36.8 C) (Oral)   Resp 18   Ht 5\' 8"  (1.727 m)   Wt 127.5 kg (281 lb)   LMP 07/25/2017 (LMP Unknown)   BMI 42.73 kg/m  No intake/output data recorded. No intake/output data recorded. NST: FHR baseline 120 bpm, Variability: moderate, Accelerations:present, Decelerations:  Absent= Cat 1/Reactive CTX:  irregular, every 1-4 minutes, lasting 30 seconds and mild to palpate Uterus gravid, soft non tender, moderate to palpate with contractions.  SVE:  Dilation: 2 Effacement (%): 50 Station: -2 Exam by:: Exam by RN and second Cytotec placed at that time, vertex by sutures verified.  Pitocin at [facility]( Will Start at 2 at 1000 am mUn/min)  Assessment:  Crystal Albertsaylor Wells Crystal [name], 32 y.o., G2P1001, with an IUP @ 5630w5d, presented for IOL for GDMA2 on glyburide, obesity and polyhydramnios. Pt stable.  Patient Active Problem List   Diagnosis Date Noted  . Gestational diabetes mellitus (GDM) affecting second pregnancy 04/29/2018  . Abnormal glucose tolerance test (GTT) during pregnancy, antepartum 02/03/2018  . SVD (spontaneous vaginal delivery) 07/30/2014  . Postpartum care following vaginal  delivery (10/13) 07/30/2014  . GDM, class A2 07/29/2014  . Numbness and tingling 08/19/2011  . Anxiety, generalized 08/19/2011  . DEPRESSION 08/06/2009  . ASTHMA 08/06/2009   NICHD: Category 1  Membranes:  Intact, no s/s of infection  Induction:    Cytotec x2 @ 0100 & 0600 on 04/29/2018  Pitocin - Will start at 2x2 at 1000  Pain management:               IV pain management: x  Nitrous:              Anticipate Epidural placement:  GBS Positive  Abx: Pencicillin to be started today  A2GDM: Was on glyburide 7.5mg  in morning and 5 at night CBG (last 3)  Recent Labs    04/29/18 0128 04/29/18 0457  GLUCAP 99 109*   Plan: Continue labor plan for IOL Continuous/intermittent monitoring Rest/Frequent position changes to facilitate fetal rotation and descent. Will reassess with cervical exam at 1000 or earlier if necessary Will ask Lawson FiscalLori CNM about stating glyburide or not due to clear liquids status Continue to monitor CBG Q4H Start pitocin per protocol 2X2 at 1000 Anticipate labor progression via induction  Md Varnado aware of plan and verbalized agreement.   Dale DurhamJade Shaina Gullatt, NP-C, CNM, MSN 04/29/2018. 6:44 AM

## 2018-04-29 NOTE — H&P (Signed)
Crystal [name] is a 32 y.o. female, G2P1001, IUP at 39.4 weeks, presenting for IOL for GDMA2 on glyburide, obesity and polyhydramnios. Pt endorse + Fm. Denies vaginal leakage. Denies vaginal bleeding. Denies feeling cxt's.   I discussed with patient risks, benefits and alternatives of labor induction including higher risk of cesarean delivery compared to spontaneous labor.  We discussed risks of induction agents including effects on fetal heart beat, contraction pattern and need for close monitoring.  Patient expressed understanding of all this and desired to proceed with the induction. Risks and benefits of induction were reviewed, including failure of method, prolonged labor, need for further intervention, risk of cesarean.  Patient and family verbalized understanding and denies any further questions at this time. Pt and family wish to proceed with induction process. Discussed induction options of Cytotec, foley bulb, AROM, and pitocin were reviewed as well as risks and benefits with use of each discussed.  Patient Active Problem List   Diagnosis Date Noted  . Gestational diabetes mellitus (GDM) affecting second pregnancy 04/29/2018  . Abnormal glucose tolerance test (GTT) during pregnancy, antepartum 02/03/2018  . SVD (spontaneous vaginal delivery) 07/30/2014  . Postpartum care following vaginal delivery (10/13) 07/30/2014  . GDM, class A2 07/29/2014  . Numbness and tingling 08/19/2011  . Anxiety, generalized 08/19/2011  . DEPRESSION 08/06/2009  . ASTHMA 08/06/2009    Prenatal Problem: gestational diabetes mellitus (On glyburide from 29 weeks. 7.5mg  in AM and 5 mg at night ) Group B Streptococcus carrier (Tx in labor, Dx on urine 02/14/18--Rx with Pen VK, check TOC, then Rx in labor.) anti-nuclear factor positive (From rheumatology labs 2017 and 2018--positive ANA titer, 1:320; positive anti-cardiolipin antibody IgM. Referral to MFM for management recommendations--BABY ASA, FOLLOW GROWTH  Q 4 WEEKS.) maternal obesity complicating pregnancy, childbirth and the puerperium, antepartum (BMI 42 at NOB--antenatal testing at 32 weeks.) anxiety disorder (On prozac) Fibromyalgia (No other autoimmune dx per primary MD records)genital warts (dont talked about in in front of family) depressive disorder (On prozac) maternal history of gestational diabetes (Hgb A1C at NOB was 5.6)  irregular periods autoimmune disease (Unsure of dx--ROI to [facility] for records and results.) Polyhydramnios (Weekly BPPs from 31 weeks. AFV 23.96 on 5/30.)  Prenatal meds: Medications Prior to Admission  Medication Sig Dispense Refill Last Dose  . acetaminophen (TYLENOL) 325 MG tablet Take 650 mg by mouth every 6 (six) hours as needed for mild pain or headache.   Taking  . albuterol (PROVENTIL HFA;VENTOLIN HFA) 108 (90 BASE) MCG/ACT inhaler Inhale 2 puffs into the lungs every 6 (six) hours as needed for wheezing or shortness of breath.   Taking  . aspirin 81 MG chewable tablet Chew by mouth daily.   Taking  . FLUoxetine HCl (PROZAC PO) Take by mouth.   Taking  . hydroxychloroquine (PLAQUENIL) 200 MG tablet TK 1 T PO BID WITH FOOD OR MILK  1 Not Taking  . ibuprofen (ADVIL,MOTRIN) 600 MG tablet Take 1 tablet (600 mg total) by mouth every 6 (six) hours. (Patient not taking: Reported on 12/01/2017) 30 tablet 0 Not Taking  . LEVORA 0.15/30, 28, 0.15-30 MG-MCG tablet TK 1 T PO D (Patient not taking: Reported on 12/01/2017) 1 Package 12 Not Taking  . Prenatal Vit-Fe Fumarate-FA (PRENATAL MULTIVITAMIN) TABS tablet Take 1 tablet by mouth daily.   Taking    Past Medical History:  Diagnosis Date  . Anemia   . Anxiety   . Asthma    inhaler used 2-3/week  .  Depression    prozac  . Fibromyalgia   . GERD (gastroesophageal reflux disease)   . Gestational diabetes    metformin  . Gestational diabetes mellitus, antepartum   . Headache   . Hx of varicella      No current facility-administered  medications on file prior to encounter.    Current Outpatient Medications on File Prior to Encounter  Medication Sig Dispense Refill  . acetaminophen (TYLENOL) 325 MG tablet Take 650 mg by mouth every 6 (six) hours as needed for mild pain or headache.    . albuterol (PROVENTIL HFA;VENTOLIN HFA) 108 (90 BASE) MCG/ACT inhaler Inhale 2 puffs into the lungs every 6 (six) hours as needed for wheezing or shortness of breath.    Marland Kitchen. aspirin 81 MG chewable tablet Chew by mouth daily.    Marland Kitchen. FLUoxetine HCl (PROZAC PO) Take by mouth.    . hydroxychloroquine (PLAQUENIL) 200 MG tablet TK 1 T PO BID WITH FOOD OR MILK  1  . ibuprofen (ADVIL,MOTRIN) 600 MG tablet Take 1 tablet (600 mg total) by mouth every 6 (six) hours. (Patient not taking: Reported on 12/01/2017) 30 tablet 0  . LEVORA 0.15/30, 28, 0.15-30 MG-MCG tablet TK 1 T PO D (Patient not taking: Reported on 12/01/2017) 1 Package 12  . Prenatal Vit-Fe Fumarate-FA (PRENATAL MULTIVITAMIN) TABS tablet Take 1 tablet by mouth daily.       No Known Allergies  History of present pregnancy: Pt Info/Preference:  Screening/Consents:  Labs:   EDD: Estimated Date of Delivery: 05/01/18  Establised: Patient's last menstrual period was 07/25/2017 (lmp unknown).  Anatomy Scan: Date: 12/08/2017 Placenta Location: posterior Genetic Screen: Panoroma: Normal: BB AFP: Begative First Tri: Quad:  Office: [facility]            First PNV: 5.2 weeks Blood Type O/Positive/-- (11/14 0000)  Language: English Last PNV: 39.2 weeks Rhogam    Flu Vaccine:  Declined   Antibody Negative (11/14 0000)  TDaP vaccine Given PNV   GTT: Early: 5.6 hga1c Third Trimester: Positive for GDM  Feeding Plan: Breat BTL: No Rubella: Immune (11/14 0000)  Contraception: undecided VBAC: No RPR: Nonreactive (11/14 0000)   Circumcision: Yes-out-pt circ   HBsAg: Negative (11/14 0000)  Pediatrician:  ???   HIV: Non-reactive (11/14 0000)   Prenatal Classes: NO Additional US: Yes see below, Q4 growth and BBP GBS:  Positive (04/30 0000)(For PCN allergy, check sensitivities)       Chlamydia: negative    MFM Referral/Consult:  GC: Negative  Support Person: Husband "Mich"   PAP: Pt unsure  Pain Management: Epidural when ready Neonatologist Referral:  Hgb Electrophoresis:  N/A  Birth Plan: No   Hgb NOB: 11.8    28W: 10  Last US on 04/26/2018  OB History    Gravida  2   Para  1   Term  1   Preterm  0   AB  0   Living  1     SAB  0   TAB  0   Ectopic  0   Multiple  0   Live Births  1          Past Medical History:  Diagnosis Date  . Anemia   . Anxiety   . Asthma    inhaler used 2-3/week  . Depression    prozac  . Fibromyalgia   . GERD (gastroesophageal reflux disease)   . Gestational diabetes    metformin  . Gestational diabetes mellitus, antepartum   .  Headache   . Hx of varicella    Past Surgical History:  Procedure Laterality Date  . CHOLECYSTECTOMY    . CHOLECYSTECTOMY    . IUD REMOVAL     mirena inserted 1/08, removal 11/12  . TONSILLECTOMY    . WISDOM TOOTH EXTRACTION  2007   Family History: family history includes COPD in her other; Cancer in her paternal grandfather; Diabetes in her other; Heart disease in her maternal grandfather and other; Hyperlipidemia in her other; Hypertension in her father, maternal grandfather, and other; Thyroid disease in her maternal grandmother. Social History:  reports that she has quit smoking. She smoked 0.00 packs per day. She has never used smokeless tobacco. She reports that she drank alcohol. She reports that she has current or past drug history. Drug: Marijuana.   Prenatal Transfer Tool  Maternal Diabetes: Yes:  Diabetes Type:  Insulin/Medication controlled, Glyburide 7.5mg  am and 5 nightly Genetic Screening: Normal Maternal Ultrasounds/Referrals: Normal Fetal Ultrasounds or other Referrals:  Referred to Materal Fetal Medicine for pts h/o antinuclear-factor positive. Possible CMV exposure.  Maternal Substance Abuse:   No Significant Maternal Medications:  Meds include: Prozac Significant Maternal Lab Results: Lab values include: Group B Strep positive, Other: CMV value of 3.5 on 02/01/2015  ROS:  Review of Systems  Constitutional: Negative.   HENT: Negative.   Eyes: Negative.   Respiratory: Negative.   Cardiovascular: Negative.   Gastrointestinal: Positive for nausea.  Genitourinary: Negative.   Musculoskeletal: Negative.   Skin: Negative.   Neurological: Negative.   Endo/Heme/Allergies: Negative.   Psychiatric/Behavioral: Negative.      Physical Exam: Temp 97.9 F (36.6 C) (Oral)   Ht 5\' 8"  (1.727 m)   Wt 127.5 kg (281 lb)   LMP 07/25/2017 (LMP Unknown)   BMI 42.73 kg/m   Physical Exam  HENT:  Head: Normocephalic.  Eyes: Pupils are equal, round, and reactive to light. Conjunctivae are normal.  Neck: Normal range of motion. Neck supple.  Cardiovascular: Normal rate and regular rhythm.  Pulmonary/Chest: Effort normal and breath sounds normal.  Abdominal: Soft. Bowel sounds are normal.  Genitourinary:  Genitourinary Comments: Uterus: non tender, gravida greater to dates. Vaginal: small genital wart on labia majora.  Pelvic: Adequate for vaginal birth, proven to 8lbs 15 ounces   Vitals reviewed.    NST: FHR baseline 130s bpm, Variability: moderate, Accelerations:present, Decelerations:  Absent= Cat 1/Reactive UC:   none SVE: external OS 2cm, but internal fingertip,  Dilation: Fingertip Effacement (%): 50 Station: -2 Exam by:: Sinan Tuch,CNM, vertex verified by fetal sutures.  Leopold's: Position vertex, EFW 9.5 via leopold's.  Vertex verified by Bedside US also. OP presentation on [place].   Labs: No results found for this or any previous visit (from the past 24 hour(s)).  Imaging:  No results found.  MAU Course: Orders Placed This Encounter  Procedures  . CBC  . RPR  . Diet clear liquid Room service appropriate? Yes; Fluid consistency: Thin  . Activity as tolerated  . Fetal  monitoring per unit policy  . Notify Physician  . Vitals signs per unit policy  . Order Rapid HIV per protocol if no results on chart  . Cervical Exam  . Discontinue foley prior to vaginal delivery  . Fundal check post delivery every 15 min x 1 hour then every 30 min x 1 hour  . If Rapid HIV test positive or known HIV positive: initiate AZT orders  . Initiate Carrier Fluid Protocol  . Insert foley catheter  . May  in and out cath x 2 for inability to void  . Measure blood pressure post delivery every 15 min x 1 hour then every 30 min x 1 hour  . Patient may have epidural placement upon request  . Discontinue Pitocin if tachysystole with non-reassuring FHR is present  . Evaluate fetal heart rate to establish reassuring pattern prior to initiating Cytotec or Pitocin  . If tachysystole WITH reassuring FHR present notify MD / CNM  . Initiate intrauterine resuscitation if tachysystole with non-reasuring FHR is present  . Labor Induction  . May administer Terbutaline 0.25 mg SQ x 1 dose if tachysystole with non-reassuring FHR is presesnt  . Nofify MD/CNM if tachysystole with non-reassuring FHR is present  . Perform a cervical exam prior to initiating Cytotec or Pitocin  . Full code  . Oxygen therapy  . Type and screen  . Insert and maintain IV Line  . Admit to Inpatient (patient's expected length of stay will be greater than 2 midnights or inpatient only procedure)   Meds ordered this encounter  Medications  . lactated ringers infusion  . lactated ringers infusion 500-1,000 mL  . oxytocin (PITOCIN) IV BOLUS FROM BAG  . oxytocin (PITOCIN) IV infusion 40 units in LR 1000 mL - Premix  . acetaminophen (TYLENOL) tablet 650 mg  . fentaNYL (SUBLIMAZE) injection 50-100 mcg  . lidocaine (PF) (XYLOCAINE) 1 % injection 30 mL  . ondansetron (ZOFRAN) injection 4 mg  . FOLLOWED BY Linked Order Group   . penicillin G potassium 5 Million Units in sodium chloride 0.9 % 250 mL IVPB     Order Specific  Question:   Antibiotic Indication:     Answer:   Group B Strep Prophylaxis   . penicillin G potassium 3 Million Units in dextrose 50mL IVPB     Order Specific Question:   Antibiotic Indication:     Answer:   Group B Strep Prophylaxis  . sodium citrate-citric acid (ORACIT) solution 30 mL  . sodium phosphate (FLEET) 7-19 GM/118ML enema 1 enema  . misoprostol (CYTOTEC) tablet 25 mcg  . oxytocin (PITOCIN) IV infusion 40 units in LR 1000 mL - Premix    Order Specific Question:   Begin infusion at:    Answer:   1 milli-unit/min (1.5 mL/hr)    Order Specific Question:   Increase infusion by:    Answer:   1 milli-unit/min (1.5 mL/hr)  . terbutaline (BRETHINE) injection 0.25 mg    Assessment/Plan: Crystal [name] is a 32 y.o. female, G2P1001, IUP at 39.4 weeks, presenting for IOL for GDMA2 on glyburide, obesity and polyhydramnios. FWB: Cat 1 Fetal Tracing.   Plan: Admit to Birthing Suite per consult with Dr Dion Body Routine [facility] orders Pain med/epidural prn PCN G for GBS prophylaxis Induction: Will start with Cytotec and reevaluate at 4 hours, then proceed with either another cytotex or foley bulb if pt is at least 2 cm. Anticipate labor progression   PP removal of genital Wart (Dont talk about in room in front of family)   Theresa Wedel NP-C, CNM, MSN 04/29/2018, 1:07 AM

## 2018-04-29 NOTE — Anesthesia Procedure Notes (Signed)
Epidural Patient location during procedure: OB Start time: 04/29/2018 1:31 PM End time: 04/29/2018 1:46 PM  Staffing Anesthesiologist: Trevor IhaHouser, Stephen A, MD Performed: anesthesiologist   Preanesthetic Checklist Completed: patient identified, site marked, surgical consent, pre-op evaluation, timeout performed, IV checked, risks and benefits discussed and monitors and equipment checked  Epidural Patient position: sitting Prep: site prepped and draped and DuraPrep Patient monitoring: continuous pulse ox and blood pressure Approach: midline Location: L3-L4 Injection technique: LOR air  Needle:  Needle type: Tuohy  Needle gauge: 17 G Needle length: 9 cm and 9 Needle insertion depth: 9 cm Catheter type: closed end flexible Catheter size: 19 Gauge Catheter at skin depth: 15 cm Test dose: negative  Assessment Events: blood not aspirated, injection not painful, no injection resistance, negative IV test and no paresthesia

## 2018-04-29 NOTE — Progress Notes (Signed)
LABOR PROGRESS NOTE  Crystal [name] is a 32 y.o. G2P1001 at 4169w5d  admitted for GDM2 and polyhydramnios; LGA  Subjective: Comfortable with epidural; AROM clear fluid; IUPC placed without difficulty;   Objective: BP 132/77   Pulse 77   Temp 98.9 F (37.2 C)   Resp 18   Ht 5\' 8"  (1.727 m)   Wt 281 lb (127.5 kg)   LMP 07/25/2017 (LMP Unknown)   SpO2 100%   BMI 42.73 kg/m  or  Vitals:   04/29/18 1730 04/29/18 1800 04/29/18 1830 04/29/18 1844  BP: (!) 111/96 123/83 132/77   Pulse: 82 70 77   Resp: 18 18 18    Temp:    98.9 F (37.2 C)  TempSrc:      SpO2:      Weight:      Height:        Category 1 strip Dilation: 5.5 Effacement (%): 80 Cervical Position: Middle Station: -3 Presentation: Vertex Exam by:: Illene BolusLori Nataya Bastedo, CNM   Labs: Lab Results  Component Value Date   WBC 7.9 04/29/2018   HGB 9.8 (L) 04/29/2018   HCT 29.9 (L) 04/29/2018   MCV 83.3 04/29/2018   PLT 220 04/29/2018    Patient Active Problem List   Diagnosis Date Noted  . Gestational diabetes mellitus (GDM) affecting second pregnancy 04/29/2018  . Abnormal glucose tolerance test (GTT) during pregnancy, antepartum 02/03/2018  . SVD (spontaneous vaginal delivery) 07/30/2014  . Postpartum care following vaginal delivery (10/13) 07/30/2014  . GDM, class A2 07/29/2014  . Numbness and tingling 08/19/2011  . Anxiety, generalized 08/19/2011  . DEPRESSION 08/06/2009  . ASTHMA 08/06/2009    Assessment / Plan:   Labor: Early Fetal Wellbeing:  Cat 1 Pain Control:  Epidural Anticipated MOD:  SVD  Luann Aspinwall A Boyd Litaker CNM 04/29/2018, 6:56 PM

## 2018-04-30 LAB — GLUCOSE, RANDOM: Glucose, Bld: 159 mg/dL — ABNORMAL HIGH (ref 70–99)

## 2018-04-30 LAB — CBC
HCT: 29.4 % — ABNORMAL LOW (ref 36.0–46.0)
Hemoglobin: 9.8 g/dL — ABNORMAL LOW (ref 12.0–15.0)
MCH: 27.4 pg (ref 26.0–34.0)
MCHC: 33.3 g/dL (ref 30.0–36.0)
MCV: 82.1 fL (ref 78.0–100.0)
Platelets: 204 10*3/uL (ref 150–400)
RBC: 3.58 MIL/uL — ABNORMAL LOW (ref 3.87–5.11)
RDW: 14.2 % (ref 11.5–15.5)
WBC: 15.5 10*3/uL — ABNORMAL HIGH (ref 4.0–10.5)

## 2018-04-30 LAB — GLUCOSE, CAPILLARY: Glucose-Capillary: 127 mg/dL — ABNORMAL HIGH (ref 70–99)

## 2018-04-30 MED ORDER — ONDANSETRON HCL 4 MG PO TABS: 4.0000 mg | ORAL_TABLET | ORAL | Status: DC | PRN

## 2018-04-30 MED ORDER — ACETAMINOPHEN 325 MG PO TABS: 650.0000 mg | ORAL_TABLET | ORAL | Status: DC | PRN

## 2018-04-30 MED ORDER — FLUOXETINE HCL 20 MG PO CAPS
40.0000 mg | ORAL_CAPSULE | Freq: Every day | ORAL | Status: DC
  Administered 2018-04-30 – 2018-05-01 (×2): 40 mg via ORAL
  Filled 2018-04-30 (×2): qty 2

## 2018-04-30 MED ORDER — DIBUCAINE 1 % RE OINT: 1.0000 "application " | TOPICAL_OINTMENT | RECTAL | Status: DC | PRN

## 2018-04-30 MED ORDER — COCONUT OIL OIL: 1.0000 "application " | TOPICAL_OIL | Status: DC | PRN

## 2018-04-30 MED ORDER — PRENATAL MULTIVITAMIN CH
1.0000 | ORAL_TABLET | Freq: Every day | ORAL | Status: DC
  Administered 2018-04-30: 1 via ORAL
  Filled 2018-04-30 (×2): qty 1

## 2018-04-30 MED ORDER — TETANUS-DIPHTH-ACELL PERTUSSIS 5-2.5-18.5 LF-MCG/0.5 IM SUSP: 0.5000 mL | Freq: Once | INTRAMUSCULAR | Status: DC

## 2018-04-30 MED ORDER — SIMETHICONE 80 MG PO CHEW: 80.0000 mg | CHEWABLE_TABLET | ORAL | Status: DC | PRN

## 2018-04-30 MED ORDER — FERROUS SULFATE 325 (65 FE) MG PO TABS
325.0000 mg | ORAL_TABLET | Freq: Two times a day (BID) | ORAL | Status: DC
  Administered 2018-04-30 – 2018-05-01 (×2): 325 mg via ORAL
  Filled 2018-04-30 (×2): qty 1

## 2018-04-30 MED ORDER — DIPHENHYDRAMINE HCL 25 MG PO CAPS: 25.0000 mg | ORAL_CAPSULE | Freq: Four times a day (QID) | ORAL | Status: DC | PRN

## 2018-04-30 MED ORDER — ZOLPIDEM TARTRATE 5 MG PO TABS: 5.0000 mg | ORAL_TABLET | Freq: Every evening | ORAL | Status: DC | PRN

## 2018-04-30 MED ORDER — WITCH HAZEL-GLYCERIN EX PADS: 1.0000 "application " | MEDICATED_PAD | CUTANEOUS | Status: DC | PRN

## 2018-04-30 MED ORDER — IBUPROFEN 600 MG PO TABS
600.0000 mg | ORAL_TABLET | Freq: Four times a day (QID) | ORAL | Status: DC
  Administered 2018-04-30 – 2018-05-01 (×5): 600 mg via ORAL
  Filled 2018-04-30 (×6): qty 1

## 2018-04-30 MED ORDER — OXYCODONE HCL 5 MG PO TABS
5.0000 mg | ORAL_TABLET | ORAL | Status: DC | PRN
  Administered 2018-04-30: 5 mg via ORAL
  Filled 2018-04-30: qty 1

## 2018-04-30 MED ORDER — SENNOSIDES-DOCUSATE SODIUM 8.6-50 MG PO TABS
2.0000 | ORAL_TABLET | ORAL | Status: DC
  Administered 2018-04-30: 2 via ORAL
  Filled 2018-04-30: qty 2

## 2018-04-30 MED ORDER — ONDANSETRON HCL 4 MG/2ML IJ SOLN: 4.0000 mg | INTRAMUSCULAR | Status: DC | PRN

## 2018-04-30 MED ORDER — BENZOCAINE-MENTHOL 20-0.5 % EX AERO
1.0000 "application " | INHALATION_SPRAY | CUTANEOUS | Status: DC | PRN
  Administered 2018-04-30: 1 via TOPICAL
  Filled 2018-04-30: qty 56

## 2018-04-30 NOTE — Progress Notes (Signed)
Mother of baby was referred for history of depression and anxiety. Referral screened out by CSW because per chart review, [name] is actively taking 20mg  of Prozac daily to address her symptoms.   Please contact CSW if mother of baby requests, if needs arise, or if mother of baby scores greater than a nine or answers yes to question ten on [place] Postpartum Depression Screen.   Edwin Dadaarol Shree Espey, MSW, LCSW-A Clinical Social Worker [facility] [phone]

## 2018-04-30 NOTE — Progress Notes (Addendum)
Subjective: Postpartum Day # 1 : S/P NSVD due to IOL for GDMA2 on glyburide, obesity and polyhydramnios. Patient up ad lib, denies syncope or dizziness. Reports consuming regular diet without issues and denies N/V. Patient reports 0 bowel movement + passing flatus.  Denies issues with urination and reports bleeding is "small."  Patient is Breastfeeding and reports going well, but feels like the baby isn't getting enough and wants to bottle feed for th moment, pt denies seeing lacatation yet.  Desires undecided for postpartum contraception.  Pain is being appropriately managed with use of motrin. Pt left in stable condition with infant in arms breastfeeding and bonding with husband at bedside. Pt endorses wanting discharge tomorrow. Pt also endorses feeling emotionally ok but was on prozac during pregnancy 20mg  daily, but prior to pregnancy was on 40mg  daily and endorses wanting to go up to 40mg  daily. Pt denies s/sx of anemia.   unrepaired periurethral tear. Feeding:  Breast Contraceptive plan:  Undecided BB: Circ Yes- out-pt circ desired.   Objective: Vital signs in last 24 hours: Patient Vitals for the past 24 hrs:  BP Temp Temp src Pulse Resp SpO2  04/30/18 0310 106/62 97.8 F (36.6 C) Oral 63 18 100 %  04/30/18 0205 113/70 98.3 F (36.8 C) Oral 93 18 97 %  04/30/18 0115 124/71 - - 70 - -  04/30/18 0100 110/68 - - 99 - -  04/30/18 0045 131/68 - - 94 - -  04/30/18 0030 (!) 148/73 99.3 F (37.4 C) - 88 18 -  04/30/18 0000 (!) 137/94 - - 85 18 -  04/29/18 2200 128/71 - - 72 - -  04/29/18 2131 (!) 146/78 97.9 F (36.6 C) Oral 72 - -  04/29/18 2108 (!) 141/82 - - 75 - -  04/29/18 2100 (!) 127/102 - - 75 18 -  04/29/18 2030 136/83 - - 68 18 -  04/29/18 1900 128/77 - - 74 18 -  04/29/18 1844 - 98.9 F (37.2 C) - - - -  04/29/18 1830 132/77 - - 77 18 -  04/29/18 1800 123/83 - - 70 18 -  04/29/18 1730 (!) 111/96 - - 82 18 -  04/29/18 1700 125/73 - - 83 18 -  04/29/18 1630 119/82 - - 83  18 -  04/29/18 1600 (!) 98/53 - - 72 16 -  04/29/18 1530 (!) 116/53 - - 65 - -  04/29/18 1500 (!) 113/56 - - 74 18 -  04/29/18 1430 (!) 106/54 - - (!) 57 18 -  04/29/18 1420 120/71 - - 68 16 -  04/29/18 1415 120/79 - - 74 18 100 %  04/29/18 1410 133/71 - - 67 18 100 %  04/29/18 1405 124/79 - - 74 16 100 %  04/29/18 1400 133/73 - - 92 16 100 %  04/29/18 1355 130/76 - - 74 16 100 %  04/29/18 1350 127/87 - - 80 18 100 %  04/29/18 1348 123/69 - - 77 18 -  04/29/18 1346 124/62 - - 69 18 -  04/29/18 1345 - - - - - 99 %  04/29/18 1344 (!) 66/37 - - 65 - -  04/29/18 1340 (!) 107/52 - - (!) 127 18 -  04/29/18 1337 - - - - - 100 %  04/29/18 1107 (!) 110/57 - - 72 18 -  04/29/18 0914 (!) 104/57 - - 82 18 -  04/29/18 0853 - 98.7 F (37.1 C) Axillary - - -     Physical Exam:  General: alert, cooperative, appears stated age and no distress Mood/Affect: Flat Lungs: clear to auscultation, no wheezes, rales or rhonchi, symmetric air entry.  Heart: normal rate, regular rhythm, normal S1, S2, no murmurs, rubs, clicks or gallops. Breast: breasts appear normal, no suspicious masses, no skin or nipple changes or axillary nodes. Abdomen:  + bowel sounds, soft, non-tender GU: perineum intact with hemostatic periureteral tear, healing well. No signs of external hematomas.  Uterine Fundus: firm Lochia: appropriate Skin: Warm, Dry. DVT Evaluation: No evidence of DVT seen on physical exam. Negative Homan's sign.  CBC Latest Ref Rng & Units 04/30/2018 04/29/2018 07/31/2014  WBC 4.0 - 10.5 K/uL 15.5(H) 7.9 11.4(H)  Hemoglobin 12.0 - 15.0 g/dL 1.1(B9.8(L) 1.4(N9.8(L) 8.2(N9.6(L)  Hematocrit 36.0 - 46.0 % 29.4(L) 29.9(L) 29.1(L)  Platelets 150 - 400 K/uL 204 220 186    Results for orders placed or performed during the hospital encounter of 04/29/18 (from the past 24 hour(s))  Glucose, capillary     Status: None   Collection Time: 04/29/18  8:53 AM  Result Value Ref Range   Glucose-Capillary 98 70 - 99 mg/dL    Comment 1 Notify RN    Comment 2 Document in Chart   Glucose, capillary     Status: None   Collection Time: 04/29/18  1:26 PM  Result Value Ref Range   Glucose-Capillary 92 70 - 99 mg/dL  Glucose, capillary     Status: None   Collection Time: 04/29/18  5:26 PM  Result Value Ref Range   Glucose-Capillary 93 70 - 99 mg/dL  Glucose, capillary     Status: None   Collection Time: 04/29/18  8:25 PM  Result Value Ref Range   Glucose-Capillary 75 70 - 99 mg/dL  CBC     Status: Abnormal   Collection Time: 04/30/18  5:22 AM  Result Value Ref Range   WBC 15.5 (H) 4.0 - 10.5 K/uL   RBC 3.58 (L) 3.87 - 5.11 MIL/uL   Hemoglobin 9.8 (L) 12.0 - 15.0 g/dL   HCT 56.229.4 (L) 13.036.0 - 86.546.0 %   MCV 82.1 78.0 - 100.0 fL   MCH 27.4 26.0 - 34.0 pg   MCHC 33.3 30.0 - 36.0 g/dL   RDW 78.414.2 69.611.5 - 29.515.5 %   Platelets 204 150 - 400 K/uL  Fasting glucose     Status: Abnormal   Collection Time: 04/30/18  5:22 AM  Result Value Ref Range   Glucose, Bld 159 (H) 70 - 99 mg/dL     CBG (last 3)  Recent Labs    04/29/18 1326 04/29/18 1726 04/29/18 2025  GLUCAP 92 93 75     I/O last 3 completed shifts: In: 1589.4 [I.V.:1589.4] Out: 1300 [Urine:1200; Blood:100]   Assessment Postpartum Day # 1 : S/P NSVD due to IOL for GDMA2 on glyburide, obesity and polyhydramnios. Pt stable. -2 involution. Breastfeeding. Hemodynamically stable. In good spirits. CBG have been stable  Plan: Continue other mgmt as ordered VTE prophylactics: Early ambulated as tolerates.  Pain control: Motrin/Tylenol PRN More education is needed regarding options for contraception. Will see lacatation and Child psychotherapistsocial worker today H/O depression/anxiety: Will start 40mg  prozac today (was on 20mg  po daily during pregnancy) GDMA2: will discontinue CBG monitoring.  Anemia: Will start iron po BID today. Dr. Dion BodyVarnado to be updated on patient status  Ken Bonn NP-C, CNM 04/30/2018, 8:20 AM

## 2018-04-30 NOTE — Anesthesia Postprocedure Evaluation (Signed)
Anesthesia Post Note  Patient: Crystal [name]  Procedure(s) Performed: AN AD HOC LABOR EPIDURAL     Patient location during evaluation: Mother Baby Anesthesia Type: Epidural Level of consciousness: awake and alert, oriented and patient cooperative Pain management: pain level controlled Vital Signs Assessment: post-procedure vital signs reviewed and stable Respiratory status: spontaneous breathing Cardiovascular status: stable Postop Assessment: no headache, epidural receding, patient able to bend at knees and no signs of nausea or vomiting Anesthetic complications: no Comments: Pain score 1.    Last Vitals:  Vitals:   04/30/18 0205 04/30/18 0310  BP: 113/70 106/62  Pulse: 93 63  Resp: 18 18  Temp: 36.8 C 36.6 C  SpO2: 97% 100%    Last Pain:  Vitals:   04/30/18 0310  TempSrc: Oral  PainSc: 2    Pain Goal: Patients Stated Pain Goal: 5 (04/29/18 1420)               Merrilyn PumaWRINKLE,Jaylan Hinojosa

## 2018-04-30 NOTE — Lactation Note (Signed)
This note was copied from a baby's chart. Lactation Consultation Note  Patient Name: Crystal [name] ZYSAY'TToday's Date: 04/30/2018 Reason for consult: Initial assessment;Term Breastfeeding consultation services and support information given and reviewed.  This is mom's second baby.  She breastfed her first for 4 weeks.  She states she felt overwhelmed and needed to supplement.  Mom reports that newborn wants to eat frequently.  Baby is 1911 hours old.  Baby is currently on breast in cross cradle hold.  When he came off I showed mom how to compress her breast for a deeper latch.  Breasts are large and nipples erect.  Breast compresses easily and colostum flows well with hand expression.  Reassured mom and instructed to feed with feeding cues.  Discussed monitoring output and weight.  Encouraged to call for assist/concerns prn.  Maternal Data Has patient been taught Hand Expression?: Yes Does the patient have breastfeeding experience prior to this delivery?: Yes  Feeding Feeding Type: Breast Fed  LATCH Score Latch: Grasps breast easily, tongue down, lips flanged, rhythmical sucking.  Audible Swallowing: Spontaneous and intermittent  Type of Nipple: Everted at rest and after stimulation  Comfort (Breast/Nipple): Soft / non-tender  Hold (Positioning): Assistance needed to correctly position infant at breast and maintain latch.  LATCH Score: 9  Interventions Interventions: Breast feeding basics reviewed;Assisted with latch;Breast compression;Skin to skin;Adjust position;Breast massage;Support pillows;Hand express  Lactation Tools Discussed/Used     Consult Status Consult Status: Follow-up Date: 05/01/18 Follow-up type: In-patient    [name], [name] 04/30/2018, 11:42 AM

## 2018-05-01 ENCOUNTER — Encounter (HOSPITAL_COMMUNITY): Payer: Self-pay | Admitting: *Deleted

## 2018-05-01 MED ORDER — FERROUS SULFATE 325 (65 FE) MG PO TABS: 325.0000 mg | ORAL_TABLET | Freq: Every day | ORAL | 3 refills | Status: AC

## 2018-05-01 MED ORDER — IBUPROFEN 600 MG PO TABS: 600.0000 mg | ORAL_TABLET | Freq: Four times a day (QID) | ORAL | 0 refills | Status: AC | PRN

## 2018-05-01 MED ORDER — FLUOXETINE HCL 40 MG PO CAPS: 40.0000 mg | ORAL_CAPSULE | Freq: Every day | ORAL | 3 refills | Status: AC

## 2018-05-01 NOTE — Discharge Summary (Signed)
OB Discharge Summary     Patient Name: Crystal [name] DOB: [birth year] MRN: [id number]  Date of admission: 04/29/2018 Delivering MD: Illene BolusLEMMONS, LORI A   Date of discharge: 05/01/2018  Admitting diagnosis: 39wks, induction Intrauterine pregnancy: 5237w0d     Secondary diagnosis:  Active Problems:   Gestational diabetes mellitus (GDM) affecting second pregnancy  Additional problems: Depression, asthma      Discharge diagnosis: Term Pregnancy Delivered                                                                                                Post partum procedures:n/a  Augmentation: AROM, Pitocin and Cytotec  Complications: None  Hospital course:  Induction of Labor With Vaginal Delivery   32 y.o. yo G2P1001 at 7037w0d was admitted to the hospital 04/29/2018 for induction of labor.  Indication for induction: A2 DM.  Patient had an uncomplicated labor course as follows: Membrane Rupture Time/Date: 6:44 PM ,04/29/2018   Intrapartum Procedures: Episiotomy: None [1]                                         Lacerations:  Labial [10];1st degree [2]  Patient had delivery of a Viable infant.  Information for the patient's newborn:  Barnie DelRandall, Boy Umeka [[id number]][[id number]]  Delivery Method: Vaginal, Spontaneous(Filed from Delivery Summary)   04/29/2018  Details of delivery can be found in separate delivery note.  Patient had a routine postpartum course. Patient is discharged home 05/01/18.  Physical exam  Vitals:   04/30/18 0900 04/30/18 1748 04/30/18 2126 05/01/18 0537  BP: 122/70 127/79 126/85 123/72  Pulse: 76 80 84 66  Resp: 20 20 18 18   Temp: 97.8 F (36.6 C) 98.2 F (36.8 C) 98.3 F (36.8 C) 97.9 F (36.6 C)  TempSrc:   Oral Axillary  SpO2:   99% 100%  Weight:      Height:       General: alert, cooperative and no distress Lochia: appropriate Uterine Fundus: firm Incision: N/A DVT Evaluation: No evidence of DVT seen on physical exam. Negative Homan's sign. No cords or  calf tenderness. No significant calf/ankle edema. Labs: Lab Results  Component Value Date   WBC 15.5 (H) 04/30/2018   HGB 9.8 (L) 04/30/2018   HCT 29.4 (L) 04/30/2018   MCV 82.1 04/30/2018   PLT 204 04/30/2018   CMP Latest Ref Rng & Units 04/30/2018  Glucose 70 - 99 mg/dL 782(N159(H)  BUN 6 - 23 mg/dL -  Creatinine 0.4 - 1.2 mg/dL -  Sodium 562135 - 130145 mEq/L -  Potassium 3.5 - 5.1 mEq/L -  Chloride 96 - 112 mEq/L -  CO2 19 - 32 mEq/L -  Calcium 8.4 - 10.5 mg/dL -  Total Protein 6.0 - 8.3 g/dL -  Total Bilirubin 0.3 - 1.2 mg/dL -  Alkaline Phos 39 - 865117 U/L -  AST 0 - 37 U/L -  ALT 0 - 35 U/L -    Discharge instruction: per After Visit Summary and "Baby and  Me Booklet".  After visit meds:  Allergies as of 05/01/2018   No Known Allergies     Medication List    STOP taking these medications   glyBURIDE 5 MG tablet Commonly known as:  DIABETA   LEVORA 0.15/30 (28) 0.15-30 MG-MCG tablet Generic drug:  levonorgestrel-ethinyl estradiol     TAKE these medications   acetaminophen 325 MG tablet Commonly known as:  TYLENOL Take 650 mg by mouth every 6 (six) hours as needed for mild pain or headache.   albuterol 108 (90 Base) MCG/ACT inhaler Commonly known as:  PROVENTIL HFA;VENTOLIN HFA Inhale 2 puffs into the lungs every 6 (six) hours as needed for wheezing or shortness of breath.   aspirin 81 MG chewable tablet Chew 81 mg by mouth daily.   ferrous sulfate 325 (65 FE) MG tablet Take 1 tablet (325 mg total) by mouth daily.   FLUoxetine 40 MG capsule Commonly known as:  PROZAC Take 1 capsule (40 mg total) by mouth daily. What changed:    medication strength  how much to take   ibuprofen 600 MG tablet Commonly known as:  ADVIL,MOTRIN Take 1 tablet (600 mg total) by mouth every 6 (six) hours as needed for moderate pain or cramping.   prenatal multivitamin Tabs tablet Take 1 tablet by mouth daily.       Diet: routine diet  Activity: Advance as tolerated. Pelvic  rest for 6 weeks. Take Iron daily for anemia. Use over the counter stool softeners like Colace or Miralax daily or as needed for constipation.   Outpatient follow up:6 weeks Follow up Appt:No future appointments. Follow up Visit:No follow-ups on file.  Postpartum contraception: Undecided  Newborn Data: Live born female  Birth Weight: 10 lb 8.1 oz (4765 g) APGAR: 7, 9  Newborn Delivery   Birth date/time:  04/29/2018 23:48:00 Delivery type:  Vaginal, Spontaneous     Baby Feeding: Breast Disposition:home with mother   05/01/2018 Janeece Riggers, CNM

## 2018-05-01 NOTE — Discharge Instructions (Signed)
Postpartum Care After Vaginal Delivery ° °The period of time right after you deliver your newborn is called the postpartum period. °What kind of medical care will I receive? °· You may continue to receive fluids and medicines through an IV tube inserted into one of your veins. °· If an incision was made near your vagina (episiotomy) or if you had some vaginal tearing during delivery, cold compresses may be placed on your episiotomy or your tear. This helps to reduce pain and swelling. °· You may be given a squirt bottle to use when you go to the bathroom. You may use this until you are comfortable wiping as usual. To use the squirt bottle, follow these steps: °? Before you urinate, fill the squirt bottle with warm water. Do not use hot water. °? After you urinate, while you are sitting on the toilet, use the squirt bottle to rinse the area around your urethra and vaginal opening. This rinses away any urine and blood. °? You may do this instead of wiping. As you start healing, you may use the squirt bottle before wiping yourself. Make sure to wipe gently. °? Fill the squirt bottle with clean water every time you use the bathroom. °· You will be given sanitary pads to wear. °How can I expect to feel? °· You may not feel the need to urinate for several hours after delivery. °· You will have some soreness and pain in your abdomen and vagina. °· If you are breastfeeding, you may have uterine contractions every time you breastfeed for up to several weeks postpartum. Uterine contractions help your uterus return to its normal size. °· It is normal to have vaginal bleeding (lochia) after delivery. The amount and appearance of lochia is often similar to a menstrual period in the first week after delivery. It will gradually decrease over the next few weeks to a dry, yellow-brown discharge. For most women, lochia stops completely by 6-8 weeks after delivery. Vaginal bleeding can vary from woman to woman. °· Within the first few  days after delivery, you may have breast engorgement. This is when your breasts feel heavy, full, and uncomfortable. Your breasts may also throb and feel hard, tightly stretched, warm, and tender. After this occurs, you may have milk leaking from your breasts. Your health care provider can help you relieve discomfort due to breast engorgement. Breast engorgement should go away within a few days. °· You may feel more sad or worried than normal due to hormonal changes after delivery. These feelings should not last more than a few days. If these feelings do not go away after several days, speak with your health care provider. °How should I care for myself? °· Tell your health care provider if you have pain or discomfort. °· Drink enough water to keep your urine clear or pale yellow. °· Wash your hands thoroughly with soap and water for at least 20 seconds after changing your sanitary pads, after using the toilet, and before holding or feeding your baby. °· If you are not breastfeeding, avoid touching your breasts a lot. Doing this can make your breasts produce more milk. °· If you become weak or lightheaded, or you feel like you might faint, ask for help before: °? Getting out of bed. °? Showering. °· Change your sanitary pads frequently. Watch for any changes in your flow, such as a sudden increase in volume, a change in color, the passing of large blood clots. If you pass a blood clot from your vagina,   save it to show to your health care provider. Do not flush blood clots down the toilet without having your health care provider look at them. °· Make sure that all your vaccinations are up to date. This can help protect you and your baby from getting certain diseases. You may need to have immunizations done before you leave the hospital. °· If desired, talk with your health care provider about methods of family planning or birth control (contraception). °How can I start bonding with my baby? °Spending as much time as  possible with your baby is very important. During this time, you and your baby can get to know each other and develop a bond. Having your baby stay with you in your room (rooming in) can give you time to get to know your baby. Rooming in can also help you become comfortable caring for your baby. Breastfeeding can also help you bond with your baby. °How can I plan for returning home with my baby? °· Make sure that you have a car seat installed in your vehicle. °? Your car seat should be checked by a certified car seat installer to make sure that it is installed safely. °? Make sure that your baby fits into the car seat safely. °· Ask your health care provider any questions you have about caring for yourself or your baby. Make sure that you are able to contact your health care provider with any questions after leaving the hospital. °This information is not intended to replace advice given to you by your health care provider. Make sure you discuss any questions you have with your health care provider. °Document Released: 08/01/2007 Document Revised: 03/08/2016 Document Reviewed: 09/08/2015 °Elsevier Interactive Patient Education © 2018 [employer] ° ° °Postpartum Depression and Baby Blues °The postpartum period begins right after the birth of a baby. During this time, there is often a great amount of joy and excitement. It is also a time of many changes in the life of the parents. Regardless of how many times a mother gives birth, each child brings new challenges and dynamics to the family. It is not unusual to have feelings of excitement along with confusing shifts in moods, emotions, and thoughts. All mothers are at risk of developing postpartum depression or the "baby blues." These mood changes can occur right after giving birth, or they may occur many months after giving birth. The baby blues or postpartum depression can be mild or severe. Additionally, postpartum depression can go away rather quickly, or it can  be a long-term condition. °What are the causes? °Raised hormone levels and the rapid drop in those levels are thought to be a main cause of postpartum depression and the baby blues. A number of hormones change during and after pregnancy. Estrogen and progesterone usually decrease right after the delivery of your baby. The levels of thyroid hormone and various cortisol steroids also rapidly drop. Other factors that play a role in these mood changes include major life events and genetics. °What increases the risk? °If you have any of the following risks for the baby blues or postpartum depression, know what symptoms to watch out for during the postpartum period. Risk factors that may increase the likelihood of getting the baby blues or postpartum depression include: °· Having a personal or family history of depression. °· Having depression while being pregnant. °· Having premenstrual mood issues or mood issues related to oral contraceptives. °· Having a lot of life stress. °· Having marital conflict. °· Lacking   a social support network. °· Having a baby with special needs. °· Having health problems, such as diabetes. ° °What are the signs or symptoms? °Symptoms of baby blues include: °· Brief changes in mood, such as going from extreme happiness to sadness. °· Decreased concentration. °· Difficulty sleeping. °· Crying spells, tearfulness. °· Irritability. °· Anxiety. ° °Symptoms of postpartum depression typically begin within the first month after giving birth. These symptoms include: °· Difficulty sleeping or excessive sleepiness. °· Marked weight loss. °· Agitation. °· Feelings of worthlessness. °· Lack of interest in activity or food. ° °Postpartum psychosis is a very serious condition and can be dangerous. Fortunately, it is rare. Displaying any of the following symptoms is cause for immediate medical attention. Symptoms of postpartum psychosis include: °· Hallucinations and delusions. °· Bizarre or disorganized  behavior. °· Confusion or disorientation. ° °How is this diagnosed? °A diagnosis is made by an evaluation of your symptoms. There are no medical or lab tests that lead to a diagnosis, but there are various questionnaires that a health care provider may use to identify those with the baby blues, postpartum depression, or psychosis. Often, a screening tool called the Edinburgh Postnatal Depression Scale is used to diagnose depression in the postpartum period. °How is this treated? °The baby blues usually goes away on its own in 1-2 weeks. Social support is often all that is needed. You will be encouraged to get adequate sleep and rest. Occasionally, you may be given medicines to help you sleep. °Postpartum depression requires treatment because it can last several months or longer if it is not treated. Treatment may include individual or group therapy, medicine, or both to address any social, physiological, and psychological factors that may play a role in the depression. Regular exercise, a healthy diet, rest, and social support may also be strongly recommended. °Postpartum psychosis is more serious and needs treatment right away. Hospitalization is often needed. °Follow these instructions at home: °· Get as much rest as you can. Nap when the baby sleeps. °· Exercise regularly. Some women find yoga and walking to be beneficial. °· Eat a balanced and nourishing diet. °· Do little things that you enjoy. Have a cup of tea, take a bubble bath, read your favorite magazine, or listen to your favorite music. °· Avoid alcohol. °· Ask for help with household chores, cooking, grocery shopping, or running errands as needed. Do not try to do everything. °· Talk to people close to you about how you are feeling. Get support from your partner, family members, friends, or other new moms. °· Try to stay positive in how you think. Think about the things you are grateful for. °· Do not spend a lot of time alone. °· Only take  over-the-counter or prescription medicine as directed by your health care provider. °· Keep all your postpartum appointments. °· Let your health care provider know if you have any concerns. °Contact a health care provider if: °You are having a reaction to or problems with your medicine. °Get help right away if: °· You have suicidal feelings. °· You think you may harm the baby or someone else. °This information is not intended to replace advice given to you by your health care provider. Make sure you discuss any questions you have with your health care provider. °Document Released: 07/08/2004 Document Revised: 03/11/2016 Document Reviewed: 07/16/2013 °Elsevier Interactive Patient Education © 2017 [employer] ° ° °

## 2018-05-01 NOTE — Lactation Note (Addendum)
This note was copied from a baby's chart. Lactation Consultation Note  Patient Name: Crystal [name] Reason for consult: Follow-up assessment;Term  Baby is 5333 hours old, 5% weight loss, at 24 hours - Bili 9.1.  LC from 11-7 - gave mom shells, hand pump and comfort gels.  LC reviewed and updated the doc flow sheets per mom.  Per mom baby got formula twice due to hand expressing/ and hand pump  And nothing. LC reassured mom that is normal / also some bruising.  LC assessed breast tissue with moms permission, noted some bruising on the  Areolas, none on the nipples, some areola edema with compressible areolas.  LC reviewed technique of hand expressing and milk easily expressed.  LC recommended prior to latch - breast massage, hand express, pre-pump to  Prime milk ducts, also reviewed positioning the importance of depth the breast.  Between feedings while awake to wear breast shells, or at least 10 mins before feeding.  Sore nipple and engorgement prevention and tx reviewed.  Mom has manual pump, and a DEBP - Medela at home. Baby presently asleep after hearing screen. LC recommended for mom to call with feeding  Cues and also discussed latching and adding SNS after latched. Mom declined and preferred  Using a bottle for supplementing.  Mom expressed she is very tired. LC recommended when she goes home to get some rest  And after rest , add post pumping after feedings until milk comes in.  Mother informed of post-discharge support and given phone number to the lactation department, including services for phone call assistance; out-patient appointments; and breastfeeding support group. List of other breastfeeding resources in the community given in the handout. Encouraged mother to call for problems or concerns related to breastfeeding.  Mom encouraged to call for feeding assessment.      Maternal Data    Feeding Feeding Type: (last fed at 0730 - formula  )  LATCH Score                   Interventions Interventions: Breast feeding basics reviewed  Lactation Tools Discussed/Used Tools: Shells;Pump;Comfort gels Shell Type: Inverted Breast pump type: Manual WIC Program: No Pump Review: Setup, frequency, and cleaning;Milk Storage   Consult Status Consult Status: Complete Date: 05/01/18    Crystal [name] [name], 9:32 AM

## 2018-05-01 NOTE — Lactation Note (Addendum)
This note was copied from a baby's chart. Lactation Consultation Note Mom's 2nd child. Mom states baby isn't getting deep enough latch at times. Mom has semi flat nipples. Very compressible. Encouraged to stimulate w/finger tips or pre-pump.  Hand pump given. Shells given. Mom has cracked nipple to Rt. W/small scab.bruising to Lt. Nipple. Comfort gels given. Discussed positioning, support, props, latching, and breast massage. Mom asked for supplement w/formula earlier d/t felt baby was hungry. Discussed cluster feeding. Mom stated he was a big baby and felt like she couldn't fill him. Baby looks jaundice. Encouraged I&O, STS, hand expression give colostrum.  Patient Name: Boy Micheline Mazeaylor Cham WUJWJ'XToday's Date: 05/01/2018 Reason for consult: Follow-up assessment;Nipple pain/trauma   Maternal Data    Feeding Feeding Type: Bottle Fed - Formula Nipple Type: Slow - flow  LATCH Score       Type of Nipple: Flat(semi flat)  Comfort (Breast/Nipple): Filling, red/small blisters or bruises, mild/mod discomfort        Interventions Interventions: Breast feeding basics reviewed;Support pillows;Position options;Breast massage;Hand express;Shells;Comfort gels;Pre-pump if needed;Hand pump;Breast compression  Lactation Tools Discussed/Used Tools: Shells;Pump;Comfort gels Shell Type: Inverted Breast pump type: Manual Pump Review: Setup, frequency, and cleaning;Milk Storage Initiated by:: Peri JeffersonL. Huel Centola RN IBCLC Date initiated:: 05/01/18   Consult Status Consult Status: Follow-up Date: 05/01/18 Follow-up type: In-patient    Charyl DancerCARVER, Albena Comes G 05/01/2018, 4:59 AM

## 2018-08-17 DIAGNOSIS — Z30431 Encounter for routine checking of intrauterine contraceptive device: Secondary | ICD-10-CM | POA: Diagnosis not present

## 2018-08-17 DIAGNOSIS — R102 Pelvic and perineal pain: Secondary | ICD-10-CM | POA: Diagnosis not present

## 2018-08-18 DIAGNOSIS — N390 Urinary tract infection, site not specified: Secondary | ICD-10-CM | POA: Diagnosis not present

## 2018-09-12 DIAGNOSIS — Z23 Encounter for immunization: Secondary | ICD-10-CM | POA: Diagnosis not present

## 2018-10-20 DIAGNOSIS — J029 Acute pharyngitis, unspecified: Secondary | ICD-10-CM | POA: Diagnosis not present

## 2018-10-20 DIAGNOSIS — H938X3 Other specified disorders of ear, bilateral: Secondary | ICD-10-CM | POA: Diagnosis not present

## 2019-01-25 DIAGNOSIS — N898 Other specified noninflammatory disorders of vagina: Secondary | ICD-10-CM | POA: Diagnosis not present

## 2019-02-08 DIAGNOSIS — R102 Pelvic and perineal pain: Secondary | ICD-10-CM | POA: Diagnosis not present

## 2019-02-08 DIAGNOSIS — M9905 Segmental and somatic dysfunction of pelvic region: Secondary | ICD-10-CM | POA: Diagnosis not present

## 2019-02-08 DIAGNOSIS — N898 Other specified noninflammatory disorders of vagina: Secondary | ICD-10-CM | POA: Diagnosis not present

## 2019-03-12 IMAGING — US US MFM OB FOLLOW-UP
1 series · 14 of 28 positions shown · non-contrast
Comparison: none

[Series 1: us mfm ob follow-up · 30 acquisitions, 14 frames shown]
[im 2/30]
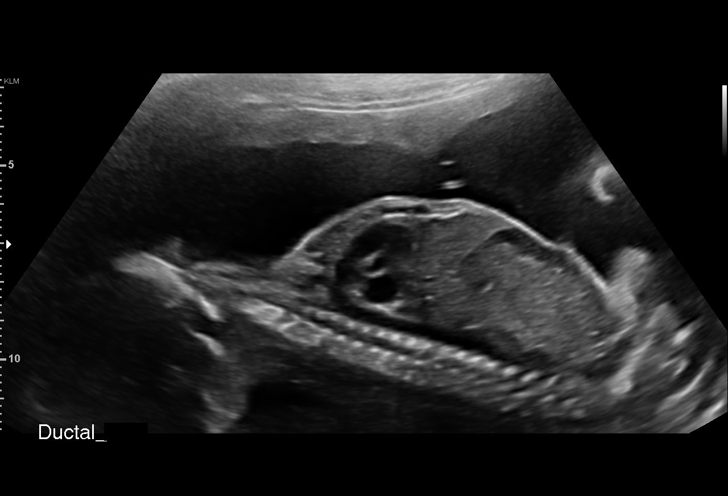
[im 4/30]
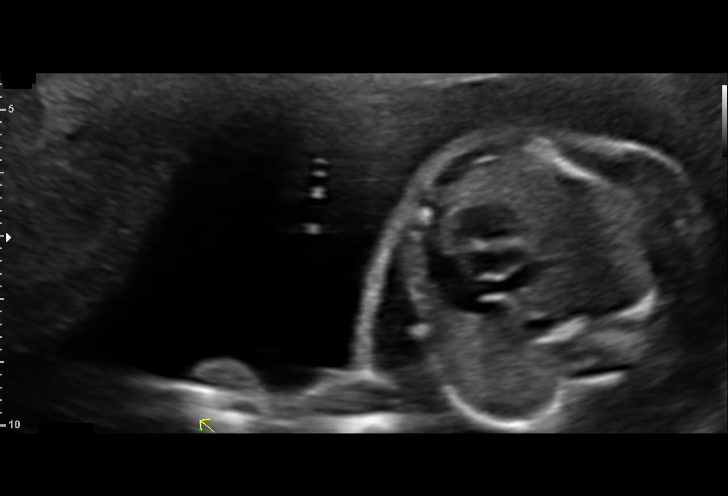
[im 6/30]
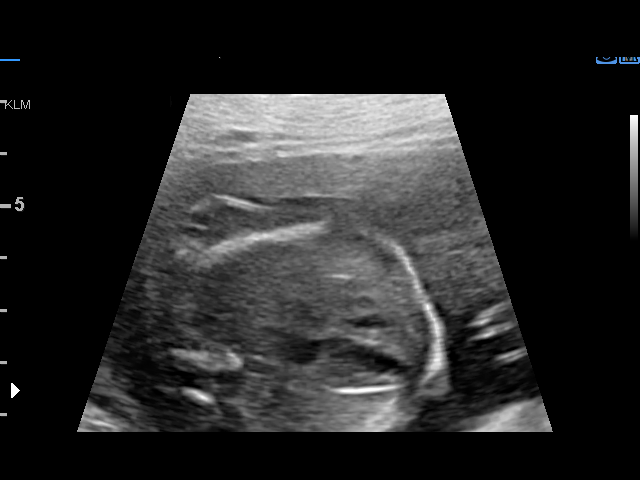
[im 8/30]
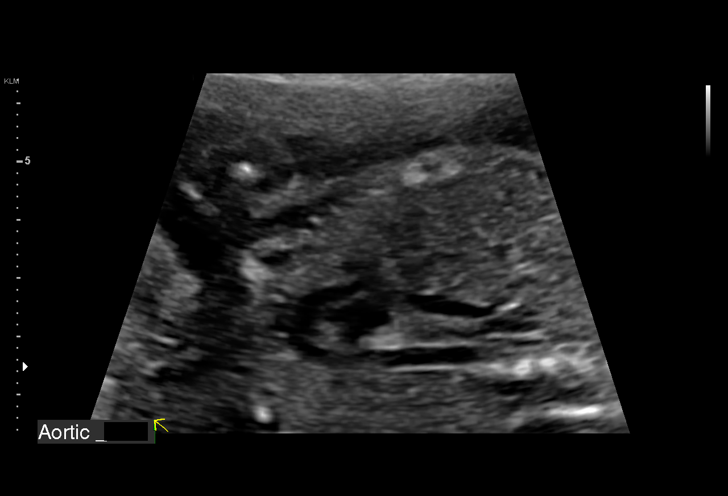
[im 10/30]
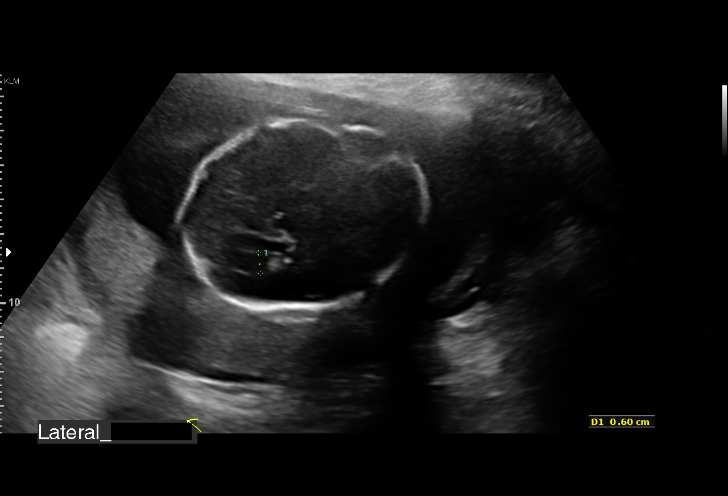
[im 12/30]
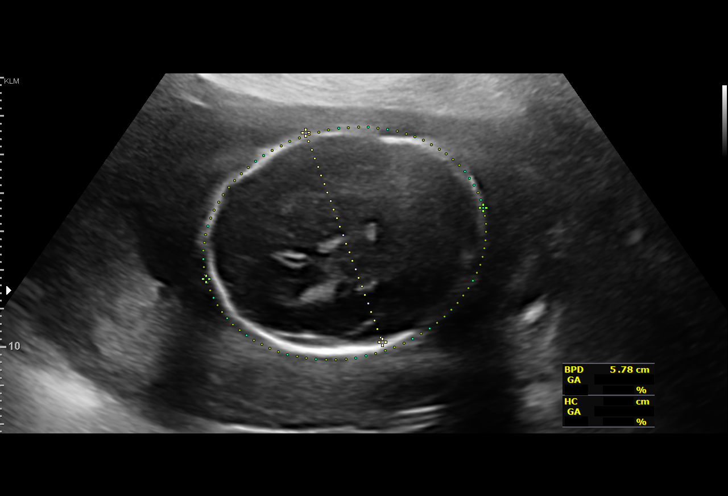
[im 14/30]
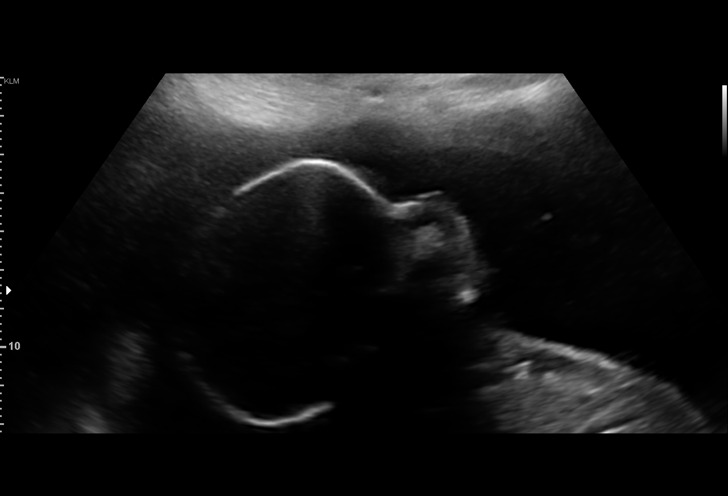
[im 17/30]
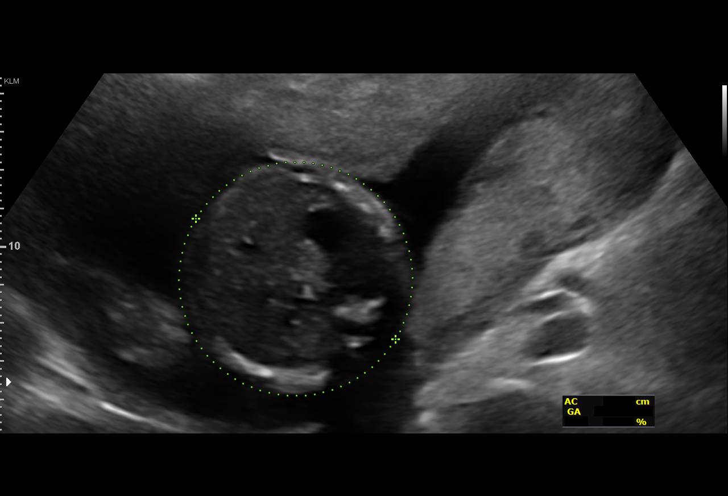
[im 19/30]
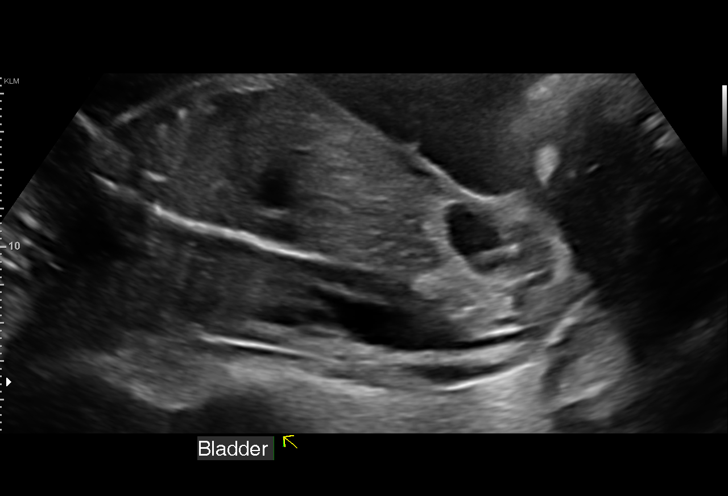
[im 21/30]
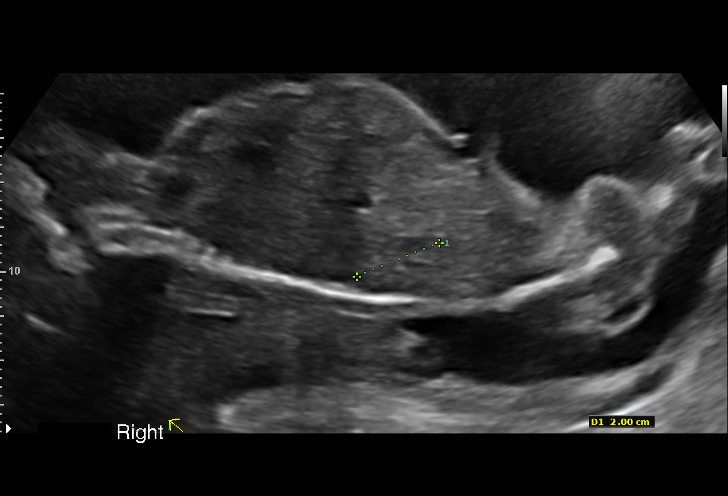
[im 23/30]
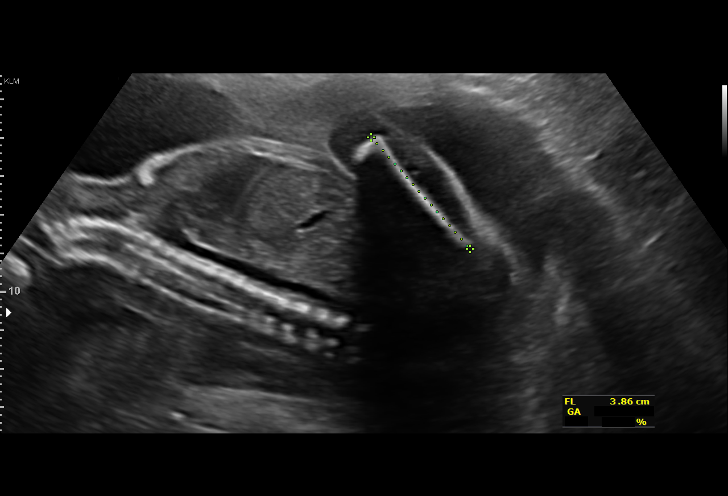
[im 25/30]
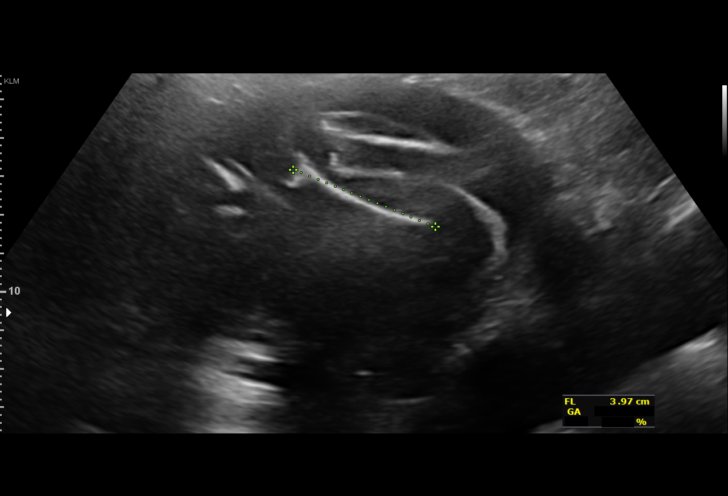
[im 27/30]
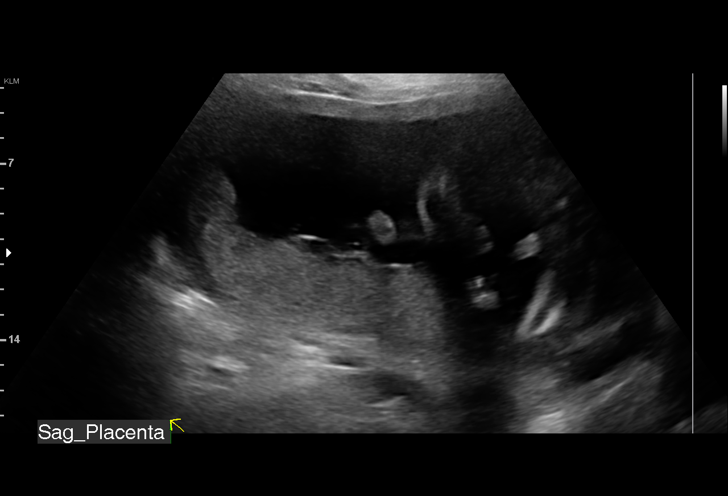
[im 30/30]
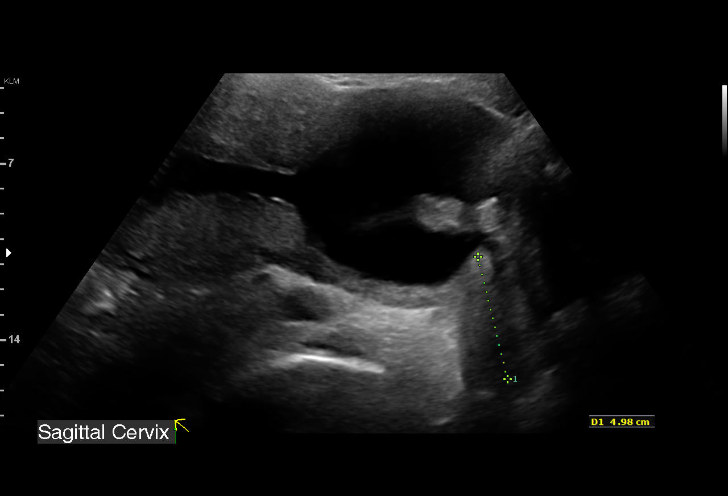

[14 of 28 positions shown; findings below may reference images not displayed]

Obstetrics &
Gynecology
[address]
[place].

1  NAJY TIGER            [id number]      [id number]     [id number]
Indications

22 weeks gestation of pregnancy
Medication exposure during first trimester of
pregnancy (Plaquenil)
Poor obstetric history: Previous gestational
diabetes
Anticardiolipin antibody affecting pregnancy,
antepartum; ASA
Antenatal follow-up for nonvisualized fetal
anatomy
Maternal morbid obesity
OB History

Gravidity:    2         Term:   1        Prem:   0        SAB:   0
TOP:          0       Ectopic:  0        Living: 1
Fetal Evaluation

Num Of Fetuses:     1
Fetal Heart         150
Rate(bpm):
Cardiac Activity:   Observed
Presentation:       Breech
Placenta:           Posterior, above cervical os
P. Cord Insertion:  Previously Visualized
Amniotic Fluid
AFI FV:      Subjectively within normal limits
Biometry

BPD:      56.8  mm     G. Age:  23w 2d         80  %    CI:        74.48   %    70 - 86
FL/HC:      18.9   %    18.4 -
HC:      208.9  mm     G. Age:  23w 0d         60  %    HC/AC:      1.11        1.06 -
AC:      188.4  mm     G. Age:  23w 4d         78  %    FL/BPD:     69.5   %    71 - 87
FL:       39.5  mm     G. Age:  22w 5d         49  %    FL/AC:      21.0   %    20 - 24

Est. FW:     572  gm      1 lb 4 oz     61  %
Gestational Age

LMP:           22w 3d        Date:  07/25/17                 EDD:   05/01/18
U/S Today:     23w 1d                                        EDD:   04/26/18
Best:          22w 3d     Det. By:  LMP  (07/25/17)          EDD:   05/01/18
Anatomy

Cranium:               Appears normal         Aortic Arch:            Appears normal
Cavum:                 Previously seen        Ductal Arch:            Appears normal
Ventricles:            Appears normal         Diaphragm:              Appears normal
Choroid Plexus:        Previously seen        Stomach:                Appears normal, left
sided
Cerebellum:            Previously seen        Abdomen:                Appears normal
Posterior Fossa:       Previously seen        Abdominal Wall:         Previously seen
Nuchal Fold:           Previously seen        Cord Vessels:           Previously seen
Face:                  Appears normal         Kidneys:                Appear normal
(orbits and profile)
Lips:                  Appears normal         Bladder:                Appears normal
Thoracic:              Appears normal         Spine:                  Previously seen
Heart:                 Appears normal         Upper Extremities:      Previously seen
(4CH, axis, and situs
RVOT:                  Appears normal         Lower Extremities:      Previously seen
LVOT:                  Appears normal

Other:  Male gender previously seen. Heels previously visualized. Technically
difficult due to maternal habitus and fetal position.
Cervix Uterus Adnexa

Cervix
Length:            4.9  cm.
Normal appearance by transabdominal scan.
Impression

SIUP at 22+3 weeks
Normal interval anatomy; anatomic survey complete
Normal amniotic fluid volume
Appropriate interval growth with EFW at the 61st %tile
Recommendations

Per MFM consultation recs: serial US for growth q 4-6 weeks
We would be pleased to perform these exams.  If desired,
call to schedule.

## 2019-05-12 IMAGING — US US MFM FETAL BPP W/O NON-STRESS
1 series · 14 of 28 positions shown · non-contrast
Comparison: none

[Series 1: us mfm fetal bpp w/o non-stress · 51 acquisitions, 14 frames shown]
[im 2/51]
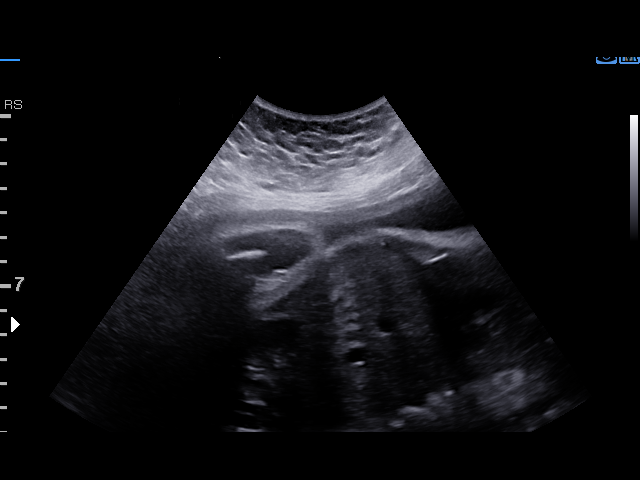
[im 6/51]
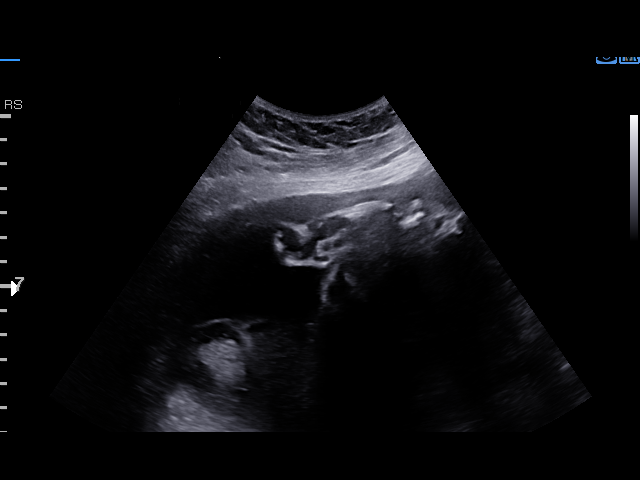
[im 10/51]
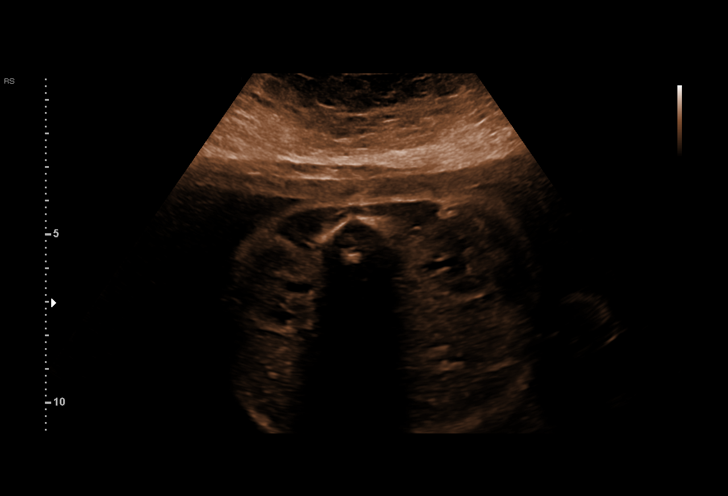
[im 13/51]
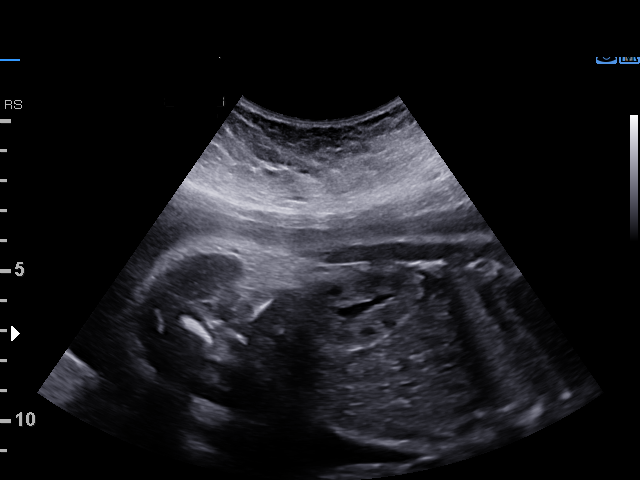
[im 17/51]
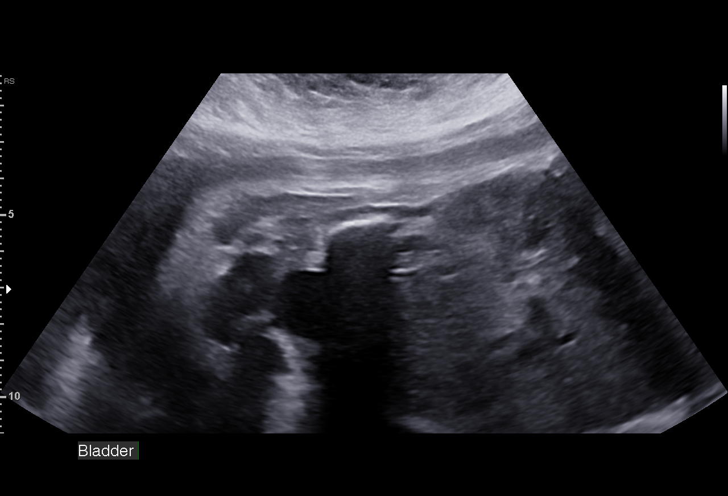
[im 21/51]
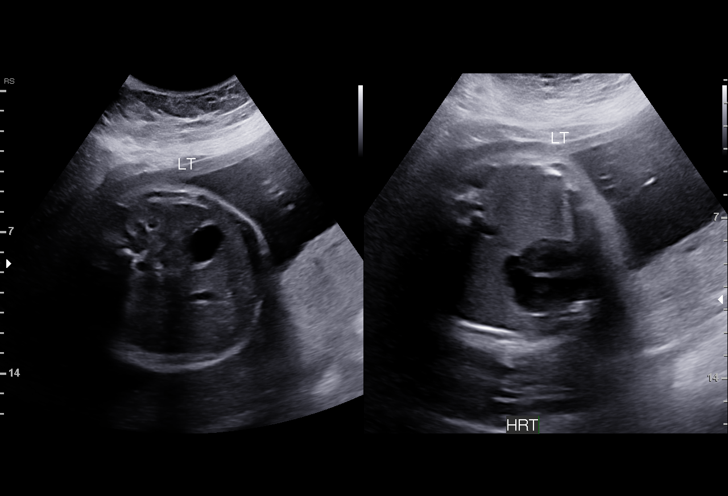
[im 25/51]
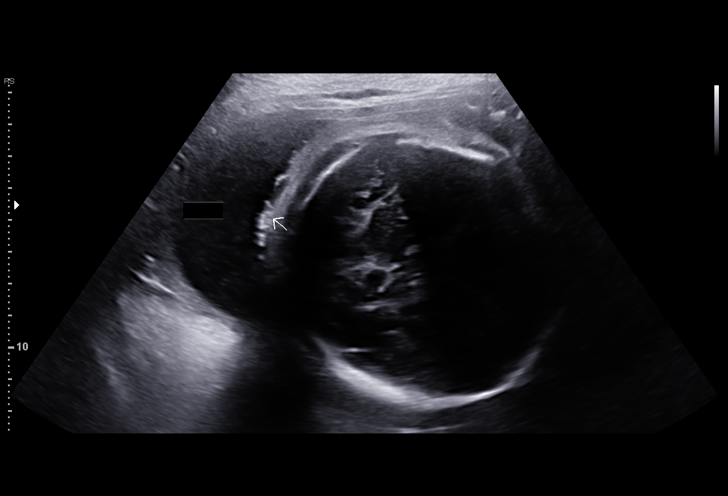
[im 28/51]
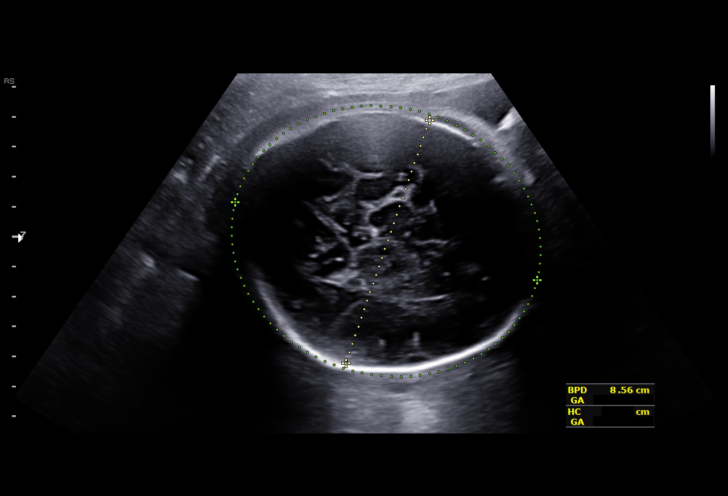
[im 32/51]
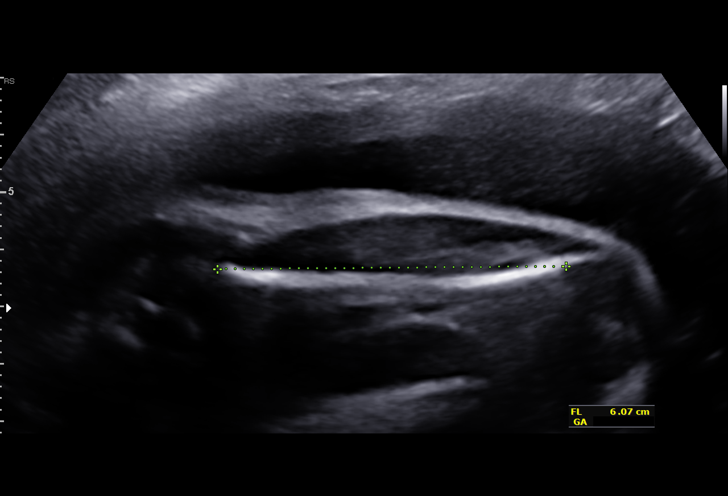
[im 36/51]
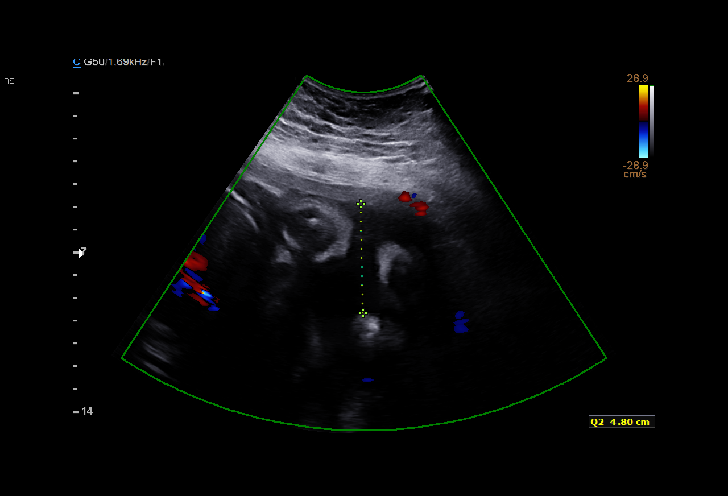
[im 39/51]
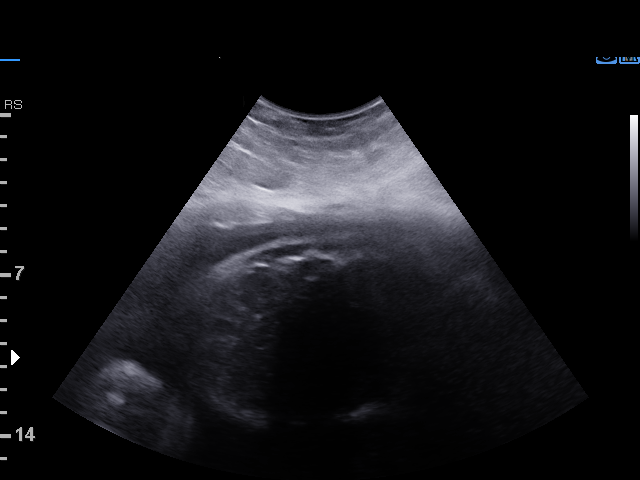
[im 43/51]
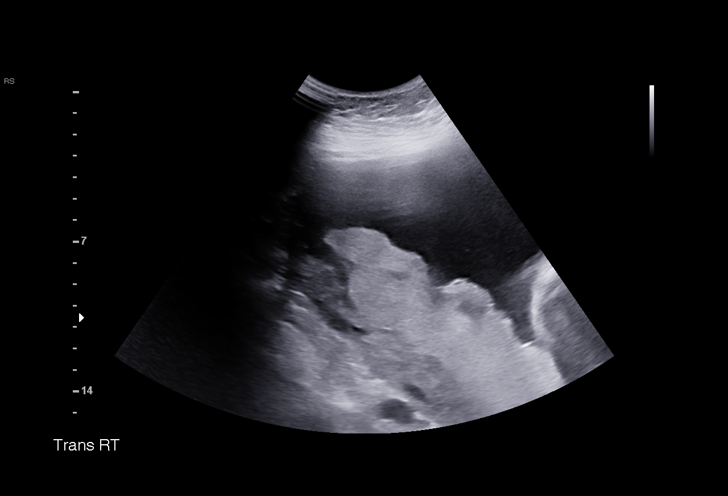
[im 47/51]
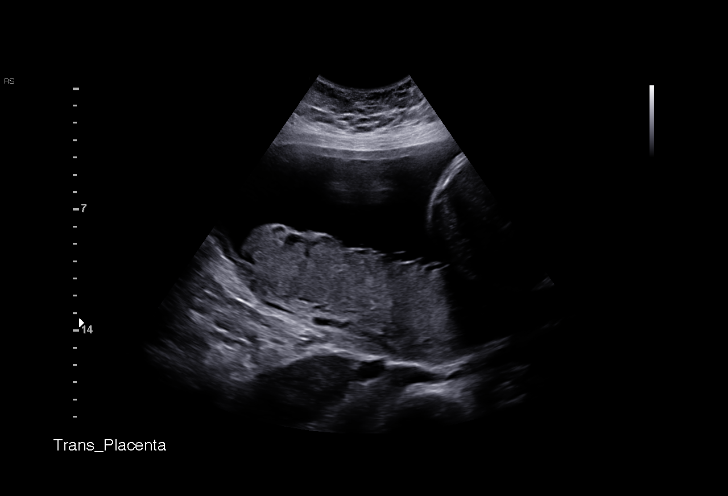
[im 51/51]
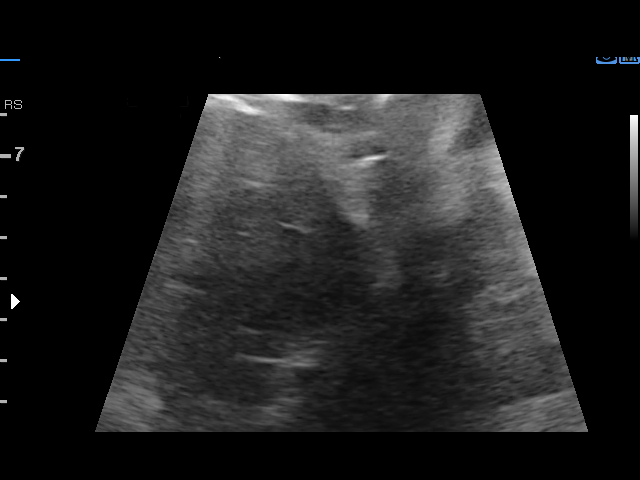

[14 of 28 positions shown; findings below may reference images not displayed]

Obstetrics &
Gynecology
[id number]
[place].

1  MILENNE MELLI            [id number]      [id number]     [id number]
2  MILENNE MELLI            [id number]      [phone]     [id number]
Indications

31 weeks gestation of pregnancy
Medication exposure during first trimester of
pregnancy (Plaquenil)
Poor obstetric history: Previous gestational
diabetes
Anticardiolipin antibody affecting pregnancy,
antepartum; ASA
Maternal morbid obesity
Encounter for other antenatal screening
follow-up
OB History

Gravidity:    2         Term:   1        Prem:   0        SAB:   0
TOP:          0       Ectopic:  0        Living: 1
Fetal Evaluation

Num Of Fetuses:     1
Fetal Heart         134
Rate(bpm):
Cardiac Activity:   Observed
Presentation:       Cephalic
Placenta:           Posterior, above cervical os
P. Cord Insertion:  Previously Visualized
Amniotic Fluid
AFI FV:      Subjectively upper-normal

AFI Sum(cm)     %Tile       Largest Pocket(cm)
25.34           96

RUQ(cm)       RLQ(cm)       LUQ(cm)        LLQ(cm)
5.97
Biophysical Evaluation

Amniotic F.V:   Pocket => 2 cm two         F. Tone:        Observed
planes
F. Movement:    Observed                   Score:          [DATE]
F. Breathing:   Observed
Biometry

BPD:      85.3  mm     G. Age:  34w 2d         99  %    CI:        79.35   %    70 - 86
FL/HC:      20.1   %    19.3 -
HC:      302.7  mm     G. Age:  33w 4d         80  %    HC/AC:      1.01        0.96 -
AC:      299.3  mm     G. Age:  34w 0d       > 97  %    FL/BPD:     71.3   %    71 - 87
FL:       60.8  mm     G. Age:  31w 4d         49  %    FL/AC:      20.3   %    20 - 24
HUM:      53.6  mm     G. Age:  31w 1d         54  %

Est. FW:    7813  gm    4 lb 12 oz      84  %
Gestational Age

LMP:           31w 1d        Date:  07/25/17                 EDD:   05/01/18
U/S Today:     33w 3d                                        EDD:   04/15/18
Best:          31w 1d     Det. By:  LMP  (07/25/17)          EDD:   05/01/18
Anatomy

Cranium:               Appears normal         Aortic Arch:            Previously seen
Cavum:                 Previously seen        Ductal Arch:            Previously seen
Ventricles:            Previously seen        Diaphragm:              Previously seen
Choroid Plexus:        Previously seen        Stomach:                Appears normal, left
sided
Cerebellum:            Previously seen        Abdomen:                Appears normal
Posterior Fossa:       Previously seen        Abdominal Wall:         Previously seen
Nuchal Fold:           Previously seen        Cord Vessels:           Previously seen
Face:                  Orbits and profile     Kidneys:                Appear normal
previously seen
Lips:                  Previously seen        Bladder:                Appears normal
Thoracic:              Appears normal         Spine:                  Previously seen
Heart:                 Previously seen        Upper Extremities:      Previously seen
RVOT:                  Previously seen        Lower Extremities:      Previously seen
LVOT:                  Previously seen

Other:  Male gender previously seen. Heels previously visualized. Technically
difficult due to maternal habitus and fetal position.
Cervix Uterus Adnexa

Cervix
Normal appearance by transabdominal scan.
Uterus
No abnormality visualized.

Left Ovary
Within normal limits.

Right Ovary
Not visualized.

Adnexa:       No abnormality visualized. No adnexal mass
visualized.
Impression

Singleton intrauterine pregnancy at 31+1 weeks with APAS
here for growth evaluation and BPP
Interval review of the anatomy shows no sonographic
markers for aneuploidy or structural anomalies
All relevant fetal anatomy has been visualized
Amniotic fluid volume is slightly high with an AFI of 25.4 cm
Estimated fetal weight shows growth in the 84th percentile
BPP [DATE]
Recommendations

Repeat BPP in 1 week for fetal wellbeing and to reassess AFI

## 2019-05-28 IMAGING — US US MFM FETAL BPP W/O NON-STRESS
1 series · 15 of 25 positions shown · non-contrast
Comparison: none

[Series 1: us mfm fetal bpp w/o non-stress · 25 acquisitions, 15 frames shown]
[im 1/25]
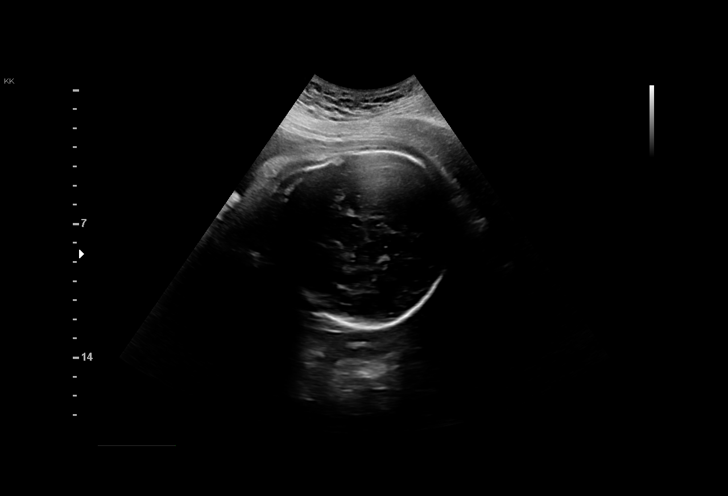
[im 3/25]
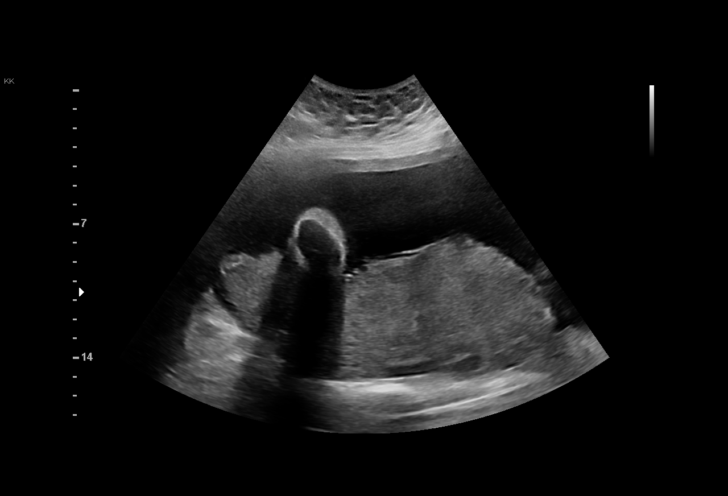
[im 5/25]
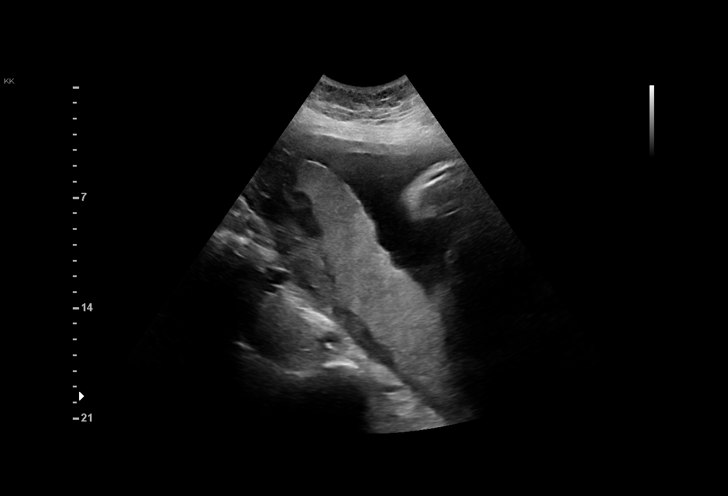
[im 6/25]
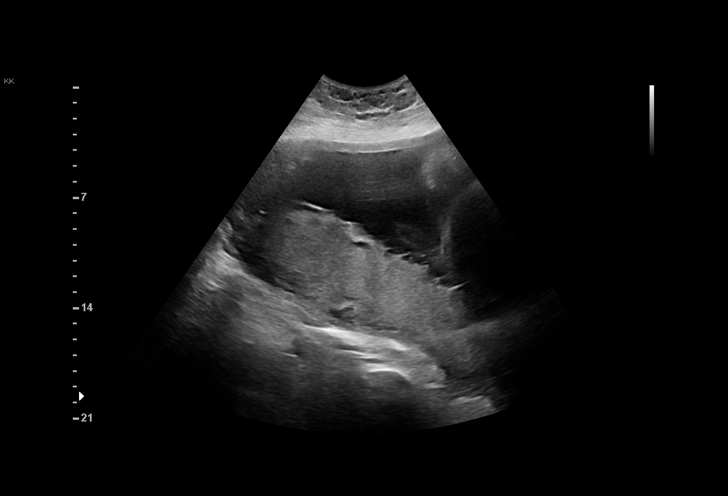
[im 8/25]
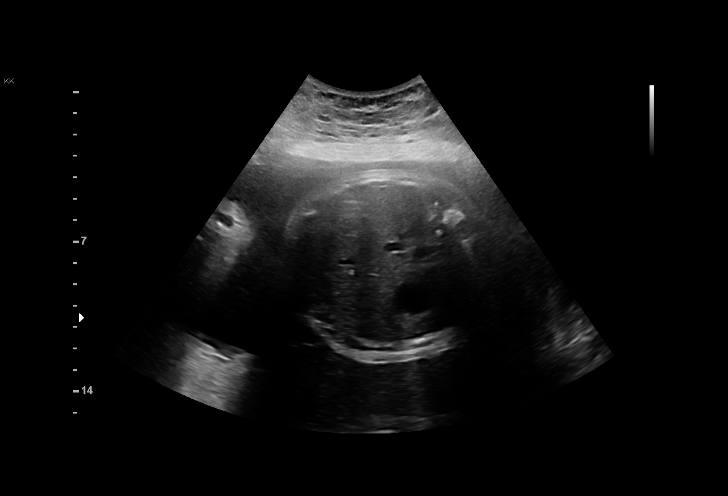
[im 10/25]
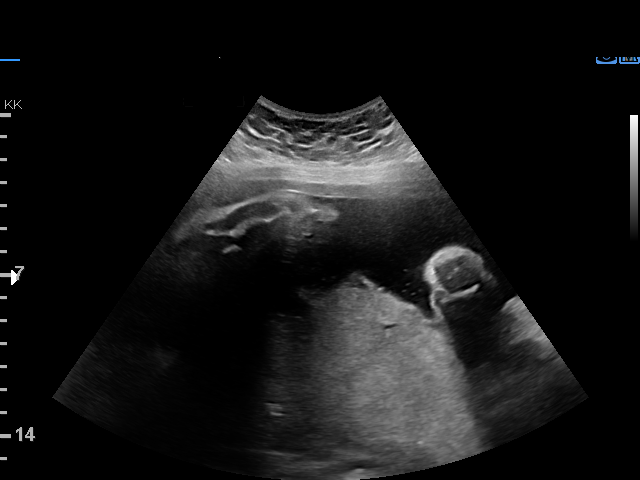
[im 11/25]
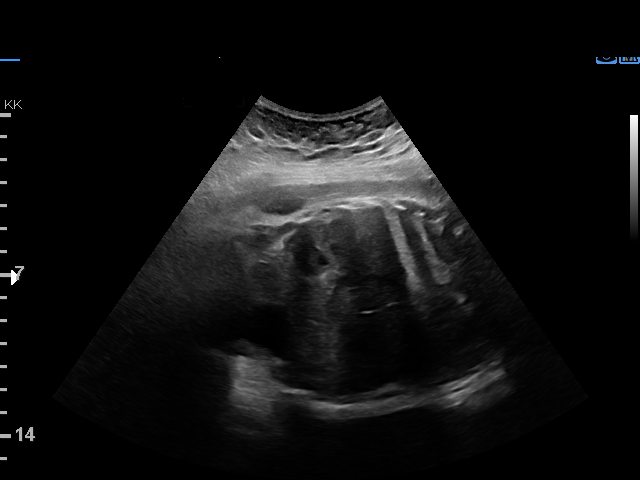
[im 13/25]
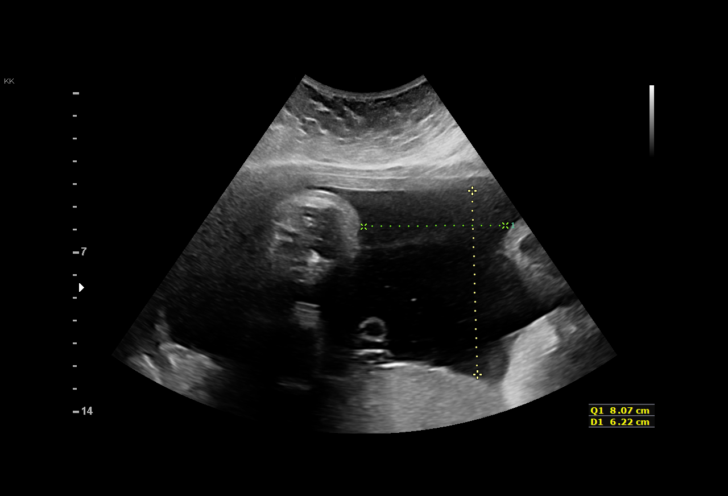
[im 15/25]
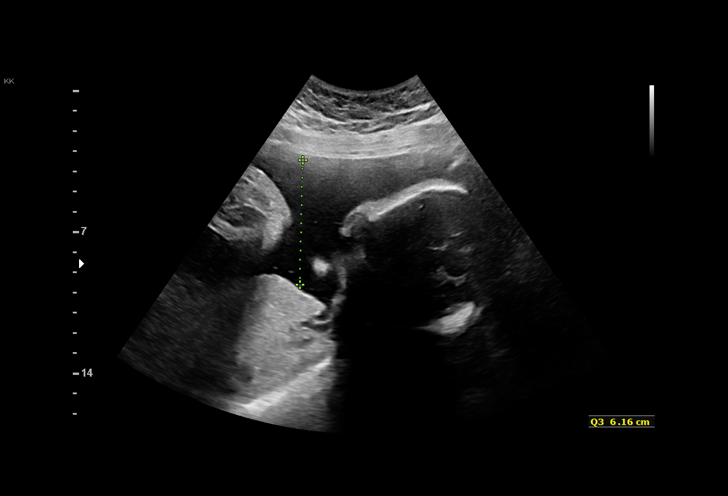
[im 16/25]
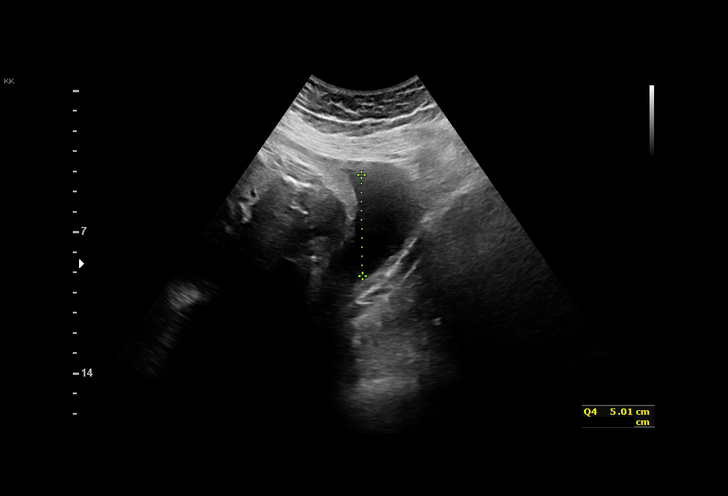
[im 18/25]
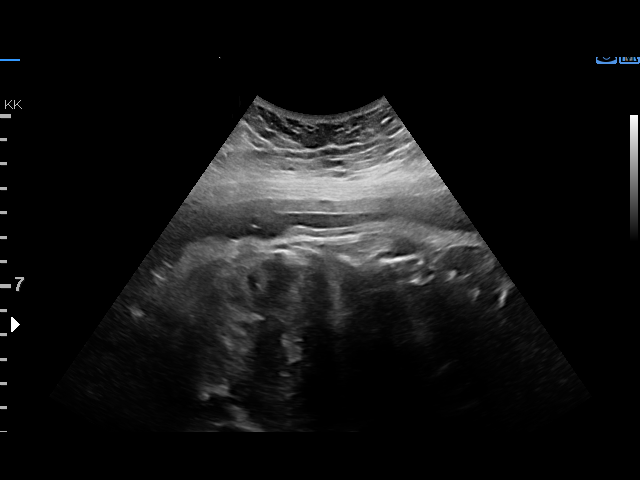
[im 20/25]
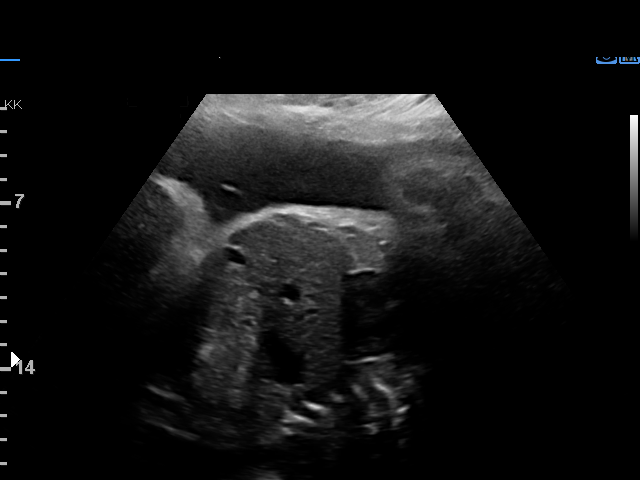
[im 21/25]
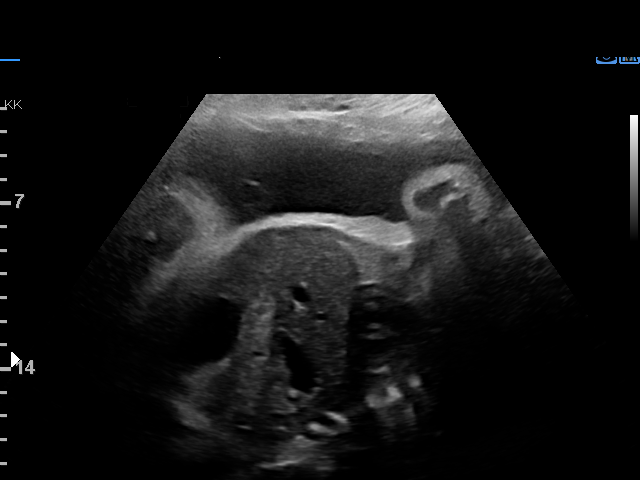
[im 23/25]
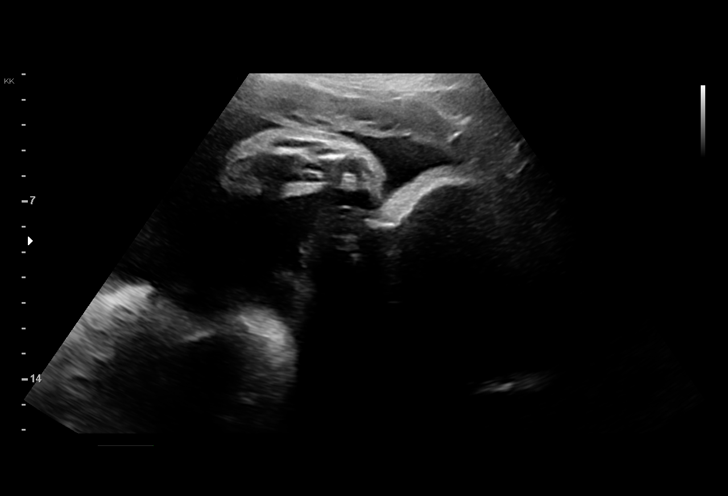
[im 25/25]
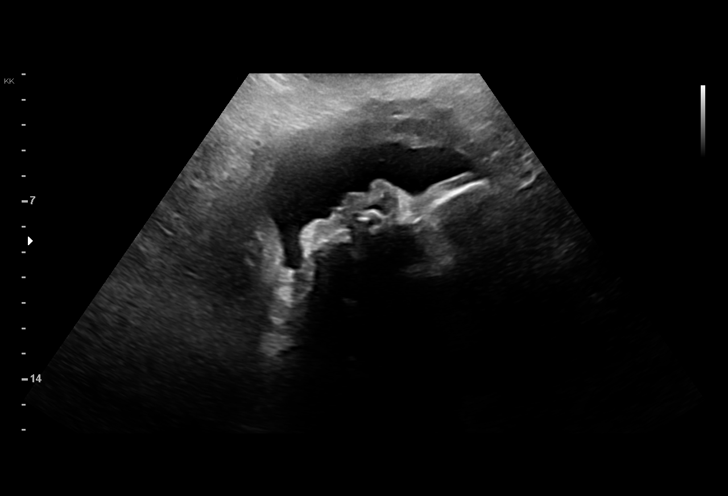

[15 of 25 positions shown; findings below may reference images not displayed]

Obstetrics &
Gynecology
[address]
[place].

1  JEREMIAH CHINA             [id number]      [id number]     [id number]
2  JEREMIAH CHINA             [id number]      [id number]     [id number]
Indications

33 weeks gestation of pregnancy
Medication exposure during first trimester of
pregnancy (Plaquenil)
Poor obstetric history: Previous gestational
diabetes
Anticardiolipin antibody affecting pregnancy,
antepartum; ASA
Maternal morbid obesity
Gestational diabetes in pregnancy,
controlled by oral hypoglycemic drugs
Polyhydramnios, third trimester, antepartum
condition or complication, unspecified fetus
OB History

Blood Type:            Height:  5'8"   Weight (lb):  269       BMI:
Gravidity:    2         Term:   1        Prem:   0        SAB:   0
TOP:          0       Ectopic:  0        Living: 1
Fetal Evaluation

Num Of Fetuses:     1
Fetal Heart         142
Rate(bpm):
Cardiac Activity:   Observed
Presentation:       Cephalic
Placenta:           Posterior, above cervical os
P. Cord Insertion:  Previously Visualized

Amniotic Fluid
AFI FV:      Subjectively within normal limits

AFI Sum(cm)     %Tile       Largest Pocket(cm)
23.96           92

RUQ(cm)       RLQ(cm)       LUQ(cm)        LLQ(cm)
8.07
Biophysical Evaluation

Amniotic F.V:   Pocket => 2 cm two         F. Tone:        Observed
planes
F. Movement:    Observed                   Score:          [DATE]
F. Breathing:   Observed
Gestational Age

LMP:           33w 3d        Date:  07/25/17                 EDD:   05/01/18
Best:          33w 3d     Det. By:  LMP  (07/25/17)          EDD:   05/01/18
Cervix Uterus Adnexa

Cervix
Not visualized (advanced GA >01wks)
Impression

SIUP at 33+3 weeks
Cephalic presentation
High normal amniotic fluid volume
BPP [DATE]
Recommendations

Continue antenatal testing for A2 DM

## 2020-01-09 ENCOUNTER — Encounter: Payer: Self-pay | Admitting: Certified Nurse Midwife
